# Patient Record
Sex: Female | Born: 1975
Health system: Southern US, Community
[De-identification: ages and names within clinical notes are randomized; demographics above are authoritative.]

## PROBLEM LIST (undated history)

## (undated) DIAGNOSIS — K859 Acute pancreatitis without necrosis or infection, unspecified: Secondary | ICD-10-CM

## (undated) DIAGNOSIS — G43909 Migraine, unspecified, not intractable, without status migrainosus: Secondary | ICD-10-CM

## (undated) DIAGNOSIS — D229 Melanocytic nevi, unspecified: Secondary | ICD-10-CM

## (undated) HISTORY — DX: Acute pancreatitis without necrosis or infection, unspecified: K85.90

## (undated) HISTORY — PX: WISDOM TOOTH EXTRACTION: SHX21

## (undated) HISTORY — PX: OTHER SURGICAL HISTORY: SHX169

---
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
## 2011-08-03 DIAGNOSIS — D229 Melanocytic nevi, unspecified: Secondary | ICD-10-CM

## 2011-08-03 HISTORY — DX: Melanocytic nevi, unspecified: D22.9

## 2011-11-18 ENCOUNTER — Emergency Department
Admission: EM | Admit: 2011-11-18 | Discharge: 2011-11-18 | Disposition: A | Discharge status: Home Health | Payer: 59 | Source: Home / Self Care | Attending: Family Medicine | Admitting: Family Medicine

## 2011-11-18 ENCOUNTER — Telehealth: Payer: Self-pay | Admitting: *Deleted

## 2011-11-18 DIAGNOSIS — R197 Diarrhea, unspecified: Secondary | ICD-10-CM

## 2011-11-18 DIAGNOSIS — R110 Nausea: Secondary | ICD-10-CM

## 2011-11-18 LAB — POCT CBC W AUTO DIFF (K'VILLE URGENT CARE)

## 2011-11-18 NOTE — Discharge Instructions (Signed)
Recommend clear liquids today, then advance to [employer]    If more cold symptoms develop, begin: Mucinex D (guaifenesin with decongestant) twice daily for congestion.  Increase fluid intake, rest. May use Afrin nasal spray (or generic oxymetazoline) twice daily for about 5 days.  Also recommend using saline nasal spray several times daily and saline nasal irrigation (AYR is a common brand) Stop all antihistamines for now, and other non-prescription cough/cold preparations. May take Delsym Cough Suppressant at bedtime for nighttime cough.  May take Ibuprofen 200mg , 4 tabs every 8 hours with food for chest/sternum discomfort.    Clear Liquid Diet The clear liquid dietconsists of foods that are liquid or will become liquid at room temperature.You should be able to see through the liquid and beverages. Examples of foods allowed on a clear liquid diet include fruit juice, broth or bouillon, gelatin, or frozen ice pops. The purpose of this diet is to provide necessary fluid, electrolytes such a sodium and potassium, and energy to keep the body functioning during times when you are not able to consume a regular diet.A clear liquid diet should not be continued for long periods of time as it is not nutritionally adequate.  REASONS FOR USING A CLEAR LIQUID DIET  In sudden onset (acute) conditions for a patient before or after surgery.   As the first step in oral feeding.   For fluid and electrolyte replacement in diarrheal diseases.   As a diet before certain medical tests are performed.  ADEQUACY The clear liquid diet is adequate only in ascorbic acid, according to the Recommended Dietary Allowances of the [employer]. CHOOSING FOODS Breads and Starches  Allowed:  None are allowed.   Avoid: All are avoided.  Vegetables  Allowed:  Strained tomato or vegetable juice.   Avoid: Any others.  Fruit  Allowed:  Strained fruit juices and fruit drinks. Include 1 serving of citrus  or vitamin C-enriched fruit juice daily.   Avoid: Any others.  Meat and Meat Substitutes  Allowed:  None are allowed.   Avoid: All are avoided.  Milk  Allowed:  None are allowed.   Avoid: All are avoided.  Soups and Combination Foods  Allowed:  Clear bouillon, broth, or strained broth-based soups.   Avoid: Any others.  Desserts and Sweets  Allowed:  Sugar, honey. High protein gelatin. Flavored gelatin, ices, or frozen ice pops that do not contain milk.   Avoid: Any others.  Fats and Oils  Allowed:  None are allowed.   Avoid: All are avoided.  Beverages  Allowed:  Carbonated beverages, cereal beverages, coffee (regular or decaffeinated), or tea.   Avoid: Any others.  Condiments  Allowed:  Iodized salt.   Avoid: Any others, including pepper.  Supplements  Allowed:  Liquid nutrition beverages.   Avoid: Any others that contain lactose or fiber.  SAMPLE MEAL PLAN Breakfast  4 oz strained orange juice.    to 1 cup gelatin (plain or fortified).   1 cup beverage (coffee or tea).   Sugar, if desired.  Midmorning Snack   cup gelatin (plain or fortified).  Lunch  1 cup broth or consomm.   4 oz strained grapefruit juice.    cup gelatin (plain or fortified).   1 cup beverage (coffee or tea).   Sugar, if desired.  Midafternoon Snack   cup fruit ice.    cup strained fruit juice.  Dinner  1 cup broth or consomm.    cup cranberry juice.    cup  flavored gelatin (plain or fortified).   1 cup beverage (coffee or tea).   Sugar, if desired.  Evening Snack  4 oz strained apple juice (vitamin C-fortified).    cup flavored gelatin (plain or fortified).  Document Released: 09/05/2005 Document Revised: 05/18/2011 Document Reviewed: 12/03/2010 [facility] Patient Information 2012 [place], [place].  B.R.A.T. Diet Your doctor has recommended the B.R.A.T. diet for you or your child until the condition improves. This is often used to help control  diarrhea and vomiting symptoms. If you or your child can tolerate clear liquids, you may have:  Bananas.   Rice.   Applesauce.   Toast (and other simple starches such as crackers, potatoes, noodles).  Be sure to avoid dairy products, meats, and fatty foods until symptoms are better. Fruit juices such as apple, grape, and prune juice can make diarrhea worse. Avoid these. Continue this diet for 2 days or as instructed by your caregiver. Document Released: 09/05/2005 Document Revised: 05/18/2011 Document Reviewed: 02/22/2007 [facility] Patient Information 2012 [place], [place].

## 2011-11-18 NOTE — ED Provider Notes (Signed)
History     CSN: [id number]  Arrival date & time 11/18/11  1191   First MD Initiated Contact with Patient 11/18/11 [phone]      Chief Complaint  Patient presents with  . Nasal Congestion    x 4 days      HPI Comments: Patient complains of onset of nasal congestion four days ago without other URI symptoms.  Last night she developed flu-like symptoms with myalgias, fatigue, headache, and chills/sweats.  She also had nausea without vomiting and diarrhea twice.  No abdominal pain.  Today she still has nausea, and diarrhea has resolved.  No urinary symptoms.  Her two children have had similar illness.  Denies recent foreign travel, or drinking untreated water in a wilderness environment.   The history is provided by the patient.    History reviewed. No pertinent past medical history.  History reviewed. No pertinent past surgical history.  Family History  Problem Relation Age of Onset  . Thyroid disease Father     History  Substance Use Topics  . Smoking status: Never Smoker   . Smokeless tobacco: Never Used  . Alcohol Use: No    OB History    Grav Para Term Preterm Abortions TAB SAB Ect Mult Living                  Review of Systems No sore throat No cough No pleuritic pain No wheezing + nasal congestion + post-nasal drainage ? sinus pain/pressure No itchy/red eyes No earache No hemoptysis No SOB No fever, + chills + nausea No vomiting No abdominal pain + diarrhea No urinary symptoms No skin rashes + fatigue + myalgias + headache Used OTC meds without relief  Allergies  Review of patient's allergies indicates no known allergies.  Home Medications  No current outpatient prescriptions on file.  BP 111/79  Pulse 101  Temp(Src) 98.5 F (36.9 C) (Oral)  Resp 16  Ht 5\' 2"  (1.575 m)  Wt 162 lb (73.483 kg)  BMI 29.63 kg/m2  SpO2 98%  Physical Exam Nursing notes and Vital Signs reviewed. Appearance:  Patient appears healthy, stated age, and in no acute  distress Eyes:  Pupils are equal, round, and reactive to light and accomodation.  Extraocular movement is intact.  Conjunctivae are not inflamed  Ears:  Canals normal.  Tympanic membranes normal.  Nose:  Mildly congested turbinates.  Mild maxillary sinus tenderness is present.  Pharynx:  Normal Neck:  Supple.  Slightly tender shotty posterior nodes are palpated bilaterally  Lungs:  Clear to auscultation.  Breath sounds are equal.  Chest:  Distinct tenderness to palpation over the mid-sternum.  Heart:  Regular rate and rhythm without murmurs, rubs, or gallops.  Abdomen:  Nontender without masses or hepatosplenomegaly.  Bowel sounds are present and increased.  No CVA or flank tenderness.  Extremities:  No edema.  No calf tenderness Skin:  No rash present.   ED Course  Procedures  none   Labs Reviewed  POCT CBC W AUTO DIFF ([facility]) CBC:  WBC 5.7; LY 11.7; MO 8.6; GR 79.7; Hgb 14.8; plates 956       1. Nausea   2. Diarrhea   Suspect viral syndrome.    MDM   Recommend clear liquids today, then advance to [employer]    If more cold symptoms develop, begin: Mucinex D (guaifenesin with decongestant) twice daily for congestion.  Increase fluid intake, rest. May use Afrin nasal spray (or generic oxymetazoline) twice daily for  about 5 days.  Also recommend using saline nasal spray several times daily and saline nasal irrigation (AYR is a common brand) Stop all antihistamines for now, and other non-prescription cough/cold preparations. May take Delsym Cough Suppressant at bedtime for nighttime cough.  May take Ibuprofen 200mg , 4 tabs every 8 hours with food for chest/sternum discomfort. Followup with PCP if not improving.        Donna Christen, MD 11/18/11 [phone]

## 2011-11-18 NOTE — ED Notes (Signed)
Patient complains of nasal congestion x 4 days. She also started last night with nausea, diarrhea, stomach pain, fever, chills and night sweats. Headache for 2 days. She did try Dayquil and Nyquil with little relief

## 2011-11-20 ENCOUNTER — Telehealth: Payer: Self-pay | Admitting: Family Medicine

## 2012-06-20 ENCOUNTER — Encounter: Payer: Self-pay | Admitting: *Deleted

## 2012-06-20 ENCOUNTER — Emergency Department
Admission: EM | Admit: 2012-06-20 | Discharge: 2012-06-20 | Disposition: A | Discharge status: Home / Self Care | Payer: 59 | Source: Home / Self Care | Attending: Family Medicine | Admitting: Family Medicine

## 2012-06-20 DIAGNOSIS — L739 Follicular disorder, unspecified: Secondary | ICD-10-CM

## 2012-06-20 DIAGNOSIS — L738 Other specified follicular disorders: Secondary | ICD-10-CM

## 2012-06-20 MED ORDER — DOXYCYCLINE HYCLATE 100 MG PO TABS: 100.0000 mg | ORAL_TABLET | Freq: Two times a day (BID) | ORAL | Status: DC

## 2012-06-20 NOTE — ED Notes (Signed)
Pt c/o rash under her RT arm off and on x 6 wks. She has applied hydrocortisone cream with some relief. Denies fever.

## 2012-06-20 NOTE — ED Provider Notes (Signed)
History     CSN: [id number]  Arrival date & time 06/20/12  1851   First MD Initiated Contact with Patient 06/20/12 1855      Chief Complaint  Patient presents with  . Rash      HPI Comments: Pt c/o rash under her RT arm off and on x 6 wks. She has applied hydrocortisone cream with some relief. Denies fever.  Patient is a 36 y.o. female presenting with rash. The history is provided by the patient.  Rash  This is a new problem. Episode onset: 6 weeks ago. The problem has not changed since onset.The problem is associated with a new detergent/soap (shaving). There has been no fever. Affected Location: right axilla. The patient is experiencing no pain. Associated symptoms include itching. Pertinent negatives include no blisters, no pain and no weeping. Treatments tried: 1% hydrocortisone cream. The treatment provided mild relief.    History reviewed. No pertinent past medical history.  History reviewed. No pertinent past surgical history.  Family History  Problem Relation Age of Onset  . Thyroid disease Father     History  Substance Use Topics  . Smoking status: Never Smoker   . Smokeless tobacco: Never Used  . Alcohol Use: No    OB History    Grav Para Term Preterm Abortions TAB SAB Ect Mult Living                  Review of Systems  Skin: Positive for itching and rash.  All other systems reviewed and are negative.    Allergies  Review of patient's allergies indicates no known allergies.  Home Medications   Current Outpatient Rx  Name Route Sig Dispense Refill  . UNKNOWN TO PATIENT      . DOXYCYCLINE HYCLATE 100 MG PO TABS Oral Take 1 tablet (100 mg total) by mouth 2 (two) times daily. 20 tablet 0    BP 111/72  Pulse 69  Temp 98.3 F (36.8 C) (Oral)  Resp 16  Ht 5\' 2"  (1.575 m)  Wt 162 lb (73.483 kg)  BMI 29.63 kg/m2  SpO2 100%  LMP 06/17/2012  Physical Exam  Constitutional: She is oriented to person, place, and time. She appears well-developed and  well-nourished. No distress.  HENT:  Head: Atraumatic.  Eyes: Pupils are equal, round, and reactive to light.  Neurological: She is alert and oriented to person, place, and time.  Skin: Skin is warm and dry. Rash noted. Rash is pustular. Rash is not urticarial.          Right axilla reveals numerous tiny (1mm dia) pustules located at follicles with surrounding erythema about 1cm dia.  No tenderness, swelling, or fluctuance.    ED Course  Procedures  With sterile technique, opened two small pustules in right axilla with 25ga needle to obtain culture specimen.  Applied Bacitracin ointment.   Labs Reviewed  WOUND CULTURE pending      1. Folliculitis       MDM  Culture pending from two pustules Begin doxycycline.  Advised to stop shaving and using deoderant right axilla until condition resolved. Followup with dermatologist if not improving.          Lattie Haw, MD 06/20/12 [phone]

## 2012-06-22 ENCOUNTER — Telehealth: Payer: Self-pay

## 2012-06-22 NOTE — ED Notes (Signed)
Wound culture still pending.

## 2012-06-23 ENCOUNTER — Telehealth: Payer: Self-pay | Admitting: Emergency Medicine

## 2012-06-23 LAB — WOUND CULTURE: Gram Stain: NONE SEEN

## 2012-09-13 ENCOUNTER — Encounter: Payer: Self-pay | Admitting: Emergency Medicine

## 2012-09-13 ENCOUNTER — Emergency Department
Admission: EM | Admit: 2012-09-13 | Discharge: 2012-09-13 | Disposition: A | Discharge status: Home / Self Care | Payer: 59 | Source: Home / Self Care | Attending: Family Medicine | Admitting: Family Medicine

## 2012-09-13 DIAGNOSIS — R062 Wheezing: Secondary | ICD-10-CM

## 2012-09-13 DIAGNOSIS — J069 Acute upper respiratory infection, unspecified: Secondary | ICD-10-CM

## 2012-09-13 MED ORDER — METHYLPREDNISOLONE ACETATE 80 MG/ML IJ SUSP
80.0000 mg | Freq: Once | INTRAMUSCULAR | Status: AC
  Administered 2012-09-13: 80 mg via INTRAMUSCULAR

## 2012-09-13 MED ORDER — HYDROCOD POLST-CHLORPHEN POLST 10-8 MG/5ML PO LQCR: 5.0000 mL | Freq: Two times a day (BID) | ORAL | Status: DC | PRN

## 2012-09-13 NOTE — ED Provider Notes (Signed)
History     CSN: [id number]  Arrival date & time 09/13/12  0904   First MD Initiated Contact with Patient 09/13/12 [phone]      Chief Complaint  Patient presents with  . URI   HPI  URI Symptoms Onset: 4-5 days  Description: rhinorrhea, nasal congestion, cough, mild wheezing, generalized malaise Modifying factors:  No hx/o asthma, has not had flu shot   Symptoms Nasal discharge: yes Fever: no Sore throat: mild; resolving  Cough: yes Wheezing: yes Ear pain: no GI symptoms: no Sick contacts: yes  Red Flags  Stiff neck:no Dyspnea: no Rash: no Swallowing difficulty: no  Sinusitis Risk Factors Headache/face pain: no Double sickening: no tooth pain: no  Allergy Risk Factors Sneezing: no Itchy scratchy throat: no Seasonal symptoms: no  Flu Risk Factors Headache: no muscle aches: mild severe fatigue: mild   History reviewed. No pertinent past medical history.  History reviewed. No pertinent past surgical history.  Family History  Problem Relation Age of Onset  . Thyroid disease Father     History  Substance Use Topics  . Smoking status: Never Smoker   . Smokeless tobacco: Never Used  . Alcohol Use: No    OB History    Grav Para Term Preterm Abortions TAB SAB Ect Mult Living                  Review of Systems  All other systems reviewed and are negative.    Allergies  Review of patient's allergies indicates not on file.  Home Medications   Current Outpatient Rx  Name  Route  Sig  Dispense  Refill  . HYDROCOD POLST-CPM POLST ER 10-8 MG/5ML PO LQCR   Oral   Take 5 mLs by mouth every 12 (twelve) hours as needed (cough).   60 mL   0   . DOXYCYCLINE HYCLATE 100 MG PO TABS   Oral   Take 1 tablet (100 mg total) by mouth 2 (two) times daily.   20 tablet   0   . UNKNOWN TO PATIENT                 BP 120/83  Pulse 94  Temp 98.4 F (36.9 C) (Oral)  Resp 16  Ht 5\' 2"  (1.575 m)  Wt 158 lb (71.668 kg)  BMI 28.90 kg/m2  SpO2 98%   LMP 09/06/2012  Physical Exam  Constitutional: She appears well-developed and well-nourished.  HENT:  Head: Normocephalic and atraumatic.  Right Ear: External ear normal.  Left Ear: External ear normal.       +nasal erythema, rhinorrhea bilaterally, + post oropharyngeal erythema    Eyes: Conjunctivae normal are normal. Pupils are equal, round, and reactive to light.  Neck: Normal range of motion. Neck supple.  Cardiovascular: Normal rate, regular rhythm and normal heart sounds.   Pulmonary/Chest: Effort normal.       Faint wheezes in bases    Abdominal: Soft. Bowel sounds are normal.  Musculoskeletal: Normal range of motion.  Neurological: She is alert.  Skin: Skin is warm.    ED Course  Procedures (including critical care time)  Labs Reviewed - No data to display No results found.   1. URI (upper respiratory infection)   2. Wheezing       MDM  depomedrol 80mg  IM x1 for wheezing component.  Tussionex for cough.  Discussed supportive care and infectious red flags.  No indications for abx or imaging at this time. Otherwise follow up as needed.  The patient and/or caregiver has been counseled thoroughly with regard to treatment plan and/or medications prescribed including dosage, schedule, interactions, rationale for use, and possible side effects and they verbalize understanding. Diagnoses and expected course of recovery discussed and will return if not improved as expected or if the condition worsens. Patient and/or caregiver verbalized understanding.             Doree Albee, MD 09/13/12 1004

## 2012-09-13 NOTE — ED Notes (Signed)
Headache, dry cough, clear nasal drainage, sore throat, congestion, chills x 4 days

## 2012-10-28 ENCOUNTER — Emergency Department
Admission: EM | Admit: 2012-10-28 | Discharge: 2012-10-28 | Disposition: A | Discharge status: Home / Self Care | Payer: 59 | Source: Home / Self Care | Attending: Family Medicine | Admitting: Family Medicine

## 2012-10-28 DIAGNOSIS — J329 Chronic sinusitis, unspecified: Secondary | ICD-10-CM

## 2012-10-28 MED ORDER — AMOXICILLIN-POT CLAVULANATE 875-125 MG PO TABS: 1.0000 | ORAL_TABLET | Freq: Two times a day (BID) | ORAL | Status: DC

## 2012-10-28 NOTE — ED Notes (Signed)
Sinus pressure started three weeks ago.

## 2012-10-28 NOTE — ED Provider Notes (Signed)
History     CSN: [id number]  Arrival date & time 10/28/12  1357   First MD Initiated Contact with Patient 10/28/12 1406      Chief Complaint  Patient presents with  . Facial Pain   HPI  SINUSITIS Onset:  3 weeks  Location: bilateral maxillary and frontal sinuses  Description:bilateral sinus pressure and pain  Modifying factors: Has had URI sxs for the past 3 weeks. Developed sinus sxs over last 4-5 days. Worsening sxs.   Symptoms Cough:  no Discharge:  yes Fever: no Sinus Pressure:  yes Ears Blocked:  no Teeth Ache:  yes Frontal Headache:  yes Second Sickening:  yes  Red Flags Change in mental state: no Change in vision: no    History reviewed. No pertinent past medical history.  History reviewed. No pertinent past surgical history.  Family History  Problem Relation Age of Onset  . Thyroid disease Father     History  Substance Use Topics  . Smoking status: Never Smoker   . Smokeless tobacco: Never Used  . Alcohol Use: No    OB History   Grav Para Term Preterm Abortions TAB SAB Ect Mult Living                  Review of Systems  All other systems reviewed and are negative.    Allergies  Review of patient's allergies indicates no known allergies.  Home Medications   Current Outpatient Rx  Name  Route  Sig  Dispense  Refill  . amoxicillin-clavulanate (AUGMENTIN) 875-125 MG per tablet   Oral   Take 1 tablet by mouth 2 (two) times daily.   20 tablet   0   . chlorpheniramine-HYDROcodone (TUSSIONEX PENNKINETIC ER) 10-8 MG/5ML LQCR   Oral   Take 5 mLs by mouth every 12 (twelve) hours as needed (cough).   60 mL   0   . doxycycline (VIBRA-TABS) 100 MG tablet   Oral   Take 1 tablet (100 mg total) by mouth 2 (two) times daily.   20 tablet   0   . UNKNOWN TO PATIENT                 BP 97/65  Pulse 92  Temp(Src) 98.2 F (36.8 C) (Oral)  Ht 5\' 2"  (1.575 m)  Wt 158 lb (71.668 kg)  BMI 28.89 kg/m2  SpO2 100%  LMP  10/07/2012  Physical Exam  Constitutional: She appears well-developed and well-nourished.  HENT:  Head: Normocephalic and atraumatic.  Right Ear: External ear normal.  Left Ear: External ear normal.  +nasal erythema, rhinorrhea bilaterally, + post oropharyngeal erythema  + maxillary sinus TTP bilaterally    Eyes: Conjunctivae are normal. Pupils are equal, round, and reactive to light.  Neck: Normal range of motion. Neck supple.  Cardiovascular: Normal rate, regular rhythm and normal heart sounds.   Pulmonary/Chest: Effort normal and breath sounds normal.  Abdominal: Soft.  Musculoskeletal: Normal range of motion.  Lymphadenopathy:    She has no cervical adenopathy.  Neurological: She is alert.  Skin: Skin is warm.    ED Course  Procedures (including critical care time)  Labs Reviewed - No data to display No results found.   1. Sinusitis       MDM  Will treat with augmentin.  Discussed infectious and ENT/resp red flags. Follow up as needed.     The patient and/or caregiver has been counseled thoroughly with regard to treatment plan and/or medications prescribed including dosage, schedule,  interactions, rationale for use, and possible side effects and they verbalize understanding. Diagnoses and expected course of recovery discussed and will return if not improved as expected or if the condition worsens. Patient and/or caregiver verbalized understanding.             Doree Albee, MD 10/28/12 [phone]

## 2013-08-06 ENCOUNTER — Encounter: Payer: Self-pay | Admitting: Emergency Medicine

## 2013-08-06 ENCOUNTER — Emergency Department
Admission: EM | Admit: 2013-08-06 | Discharge: 2013-08-06 | Disposition: A | Discharge status: Home / Self Care | Payer: 59 | Source: Home / Self Care | Attending: Emergency Medicine | Admitting: Emergency Medicine

## 2013-08-06 DIAGNOSIS — J029 Acute pharyngitis, unspecified: Secondary | ICD-10-CM

## 2013-08-06 MED ORDER — AMOXICILLIN 875 MG PO TABS: 875.0000 mg | ORAL_TABLET | Freq: Two times a day (BID) | ORAL | Status: DC

## 2013-08-06 NOTE — ED Provider Notes (Signed)
CSN: [id number]     Arrival date & time 08/06/13  1841 History   First MD Initiated Contact with Patient 08/06/13 1844     No chief complaint on file.  (Consider location/radiation/quality/duration/timing/severity/associated sxs/prior Treatment) HPI Miranda Pena is a 37 y.o. female who complains of onset of cold symptoms for 1 day.  The symptoms are constant and mild in severity.  Her child was diagnosed with strep throat today (positive swab) and they shared a glass 2 days ago. + sore throat (mild) No cough No pleuritic pain No wheezing No nasal congestion No post-nasal drainage No sinus pain/pressure No chest congestion No itchy/red eyes No earache No hemoptysis No SOB + chills No fever No nausea No vomiting No abdominal pain No diarrhea No skin rashes + fatigue No myalgias No headache     No past medical history on file. No past surgical history on file. Family History  Problem Relation Age of Onset  . Thyroid disease Father    History  Substance Use Topics  . Smoking status: Never Smoker   . Smokeless tobacco: Never Used  . Alcohol Use: No   OB History   Grav Para Term Preterm Abortions TAB SAB Ect Mult Living                 Review of Systems  All other systems reviewed and are negative.    Allergies  Review of patient's allergies indicates no known allergies.  Home Medications   Current Outpatient Rx  Name  Route  Sig  Dispense  Refill  . amoxicillin-clavulanate (AUGMENTIN) 875-125 MG per tablet   Oral   Take 1 tablet by mouth 2 (two) times daily.   20 tablet   0   . chlorpheniramine-HYDROcodone (TUSSIONEX PENNKINETIC ER) 10-8 MG/5ML LQCR   Oral   Take 5 mLs by mouth every 12 (twelve) hours as needed (cough).   60 mL   0   . doxycycline (VIBRA-TABS) 100 MG tablet   Oral   Take 1 tablet (100 mg total) by mouth 2 (two) times daily.   20 tablet   0   . UNKNOWN TO PATIENT                There were no vitals taken for this  visit. Physical Exam  Nursing note and vitals reviewed. Constitutional: She is oriented to person, place, and time. She appears well-developed and well-nourished.  HENT:  Head: Normocephalic and atraumatic.  Right Ear: Tympanic membrane, external ear and ear canal normal.  Left Ear: Tympanic membrane, external ear and ear canal normal.  Nose: Nose normal.  Mouth/Throat: No oropharyngeal exudate, posterior oropharyngeal edema or posterior oropharyngeal erythema.  Eyes: No scleral icterus.  Neck: Neck supple.  Cardiovascular: Regular rhythm and normal heart sounds.   Pulmonary/Chest: Effort normal and breath sounds normal. No respiratory distress.  Lymphadenopathy:    She has no cervical adenopathy.  Neurological: She is alert and oriented to person, place, and time.  Skin: Skin is warm and dry.  Psychiatric: She has a normal mood and affect. Her speech is normal.    ED Course  Procedures (including critical care time) Labs Review Labs Reviewed - No data to display Imaging Review No results found.  EKG Interpretation    Date/Time:    Ventricular Rate:    PR Interval:    QRS Duration:   QT Interval:    QTC Calculation:   R Axis:     Text Interpretation:  MDM   1. Acute pharyngitis    Take the prescribed antibiotic as instructed.  History of sharing in class as well as a positive swab today for strep, and her new onset sore throat and chills and fatigue, I think is reasonable to treat.  I called her in amoxicillin to her pharmacy and she will wake up tomorrow morning and see how she feels.  If still with a sore throat or getting worse she will start the antibiotics.  No swab culture was done today. Can take tylenol every 6 hours or motrin every 8 hours for pain or fever. Follow up with your primary doctor if no improvement in 5-7 days, sooner if increasing pain, fever, or new symptoms.     Marlaine Hind, MD 08/06/13 [phone]

## 2013-08-06 NOTE — ED Notes (Signed)
Sore throat, fatigue, headache today. 37 yr-old son dx w/strep today

## 2013-09-10 ENCOUNTER — Encounter: Payer: Self-pay | Admitting: Emergency Medicine

## 2013-09-10 ENCOUNTER — Emergency Department (INDEPENDENT_AMBULATORY_CARE_PROVIDER_SITE_OTHER)
Admission: EM | Admit: 2013-09-10 | Discharge: 2013-09-10 | Disposition: A | Discharge status: Home / Self Care | Payer: 59 | Source: Home / Self Care | Attending: Family Medicine | Admitting: Family Medicine

## 2013-09-10 DIAGNOSIS — J0100 Acute maxillary sinusitis, unspecified: Secondary | ICD-10-CM

## 2013-09-10 MED ORDER — AMOXICILLIN 875 MG PO TABS: 875.0000 mg | ORAL_TABLET | Freq: Two times a day (BID) | ORAL | Status: DC

## 2013-09-10 NOTE — ED Notes (Signed)
Pt c/o URI x 2 wks, with 4-5 days of nasal congestion, thick green nasal d/c, and sinus pressure. Denies fever.

## 2013-09-10 NOTE — ED Provider Notes (Signed)
CSN: [id number]     Arrival date & time 09/10/13  1191 History   First MD Initiated Contact with Patient 09/10/13 [phone]     Chief Complaint  Patient presents with  . Nasal Congestion  . Facial Pain      HPI Comments: Patient complains of onset of a mild cold about two weeks ago with runny nose and sore throat.  She has had minimal cough.  She has now developed increasing facial pressure and post nasal drainage.  No fevers, chills, and sweats   The history is provided by the patient.    History reviewed. No pertinent past medical history. History reviewed. No pertinent past surgical history. Family History  Problem Relation Age of Onset  . Thyroid disease Father    History  Substance Use Topics  . Smoking status: Never Smoker   . Smokeless tobacco: Never Used  . Alcohol Use: No   OB History   Grav Para Term Preterm Abortions TAB SAB Ect Mult Living                 Review of Systems No sore throat + minimal cough No pleuritic pain No wheezing + nasal congestion + post-nasal drainage + sinus pain/pressure No itchy/red eyes No earache No hemoptysis No SOB No fever/chills No nausea No vomiting No abdominal pain No diarrhea No urinary symptoms No skin rash No fatigue No myalgias + headache Used OTC meds without relief  Allergies  Review of patient's allergies indicates no known allergies.  Home Medications   Current Outpatient Rx  Name  Route  Sig  Dispense  Refill  . amoxicillin (AMOXIL) 875 MG tablet   Oral   Take 1 tablet (875 mg total) by mouth 2 (two) times daily.   20 tablet   0   . UNKNOWN TO PATIENT                BP 115/83  Pulse 83  Temp(Src) 98.9 F (37.2 C) (Oral)  Resp 18  Ht 5\' 2"  (1.575 m)  Wt 150 lb (68.04 kg)  BMI 27.43 kg/m2  SpO2 98%  LMP 09/09/2013 Physical Exam Nursing notes and Vital Signs reviewed. Appearance:  Patient appears healthy, stated age, and in no acute distress Eyes:  Pupils are equal, round, and  reactive to light and accomodation.  Extraocular movement is intact.  Conjunctivae are not inflamed  Ears:  Canals normal.  Tympanic membranes normal.  Nose:  Mildly congested turbinates.   Maxillary sinus tenderness is present.  Pharynx:  Normal Neck:  Supple.  Slightly tender shotty posterior nodes are palpated bilaterally  Lungs:  Clear to auscultation.  Breath sounds are equal.  Heart:  Regular rate and rhythm without murmurs, rubs, or gallops.  Abdomen:  Nontender without masses or hepatosplenomegaly.  Bowel sounds are present.  No CVA or flank tenderness.  Extremities:  No edema.   Skin:  No rash present.   Procedures  none       MDM   1. Acute maxillary sinusitis    Begin amoxicillin for 10 days Continue plain Mucinex (1200 mg guaifenesin) twice daily for cough and congestion.  Continue Sudafed.  Increase fluid intake, rest. May use Afrin nasal spray (or generic oxymetazoline) twice daily for about 5 days.  Also recommend using saline nasal spray several times daily and saline nasal irrigation (AYR is a common brand) Stop all antihistamines for now, and other non-prescription cough/cold preparations. Followup with Family Doctor if not improved in one week.  Lattie Haw, MD 09/10/13 [phone]

## 2014-05-02 ENCOUNTER — Emergency Department
Admission: EM | Admit: 2014-05-02 | Discharge: 2014-05-02 | Disposition: A | Discharge status: Home / Self Care | Payer: 59 | Source: Home / Self Care | Attending: Family Medicine | Admitting: Family Medicine

## 2014-05-02 ENCOUNTER — Emergency Department (HOSPITAL_COMMUNITY): Payer: 59

## 2014-05-02 ENCOUNTER — Encounter (HOSPITAL_COMMUNITY): Payer: Self-pay | Admitting: Emergency Medicine

## 2014-05-02 ENCOUNTER — Encounter: Payer: Self-pay | Admitting: Emergency Medicine

## 2014-05-02 ENCOUNTER — Inpatient Hospital Stay (HOSPITAL_COMMUNITY)
Admission: EM | Admit: 2014-05-02 | Discharge: 2014-05-05 | DRG: 440 | Disposition: A | Discharge status: Home / Self Care | Payer: 59 | Attending: Internal Medicine | Admitting: Internal Medicine

## 2014-05-02 DIAGNOSIS — K858 Other acute pancreatitis without necrosis or infection: Secondary | ICD-10-CM

## 2014-05-02 DIAGNOSIS — R1013 Epigastric pain: Secondary | ICD-10-CM | POA: Diagnosis not present

## 2014-05-02 DIAGNOSIS — R112 Nausea with vomiting, unspecified: Secondary | ICD-10-CM

## 2014-05-02 DIAGNOSIS — K219 Gastro-esophageal reflux disease without esophagitis: Secondary | ICD-10-CM

## 2014-05-02 DIAGNOSIS — G43909 Migraine, unspecified, not intractable, without status migrainosus: Secondary | ICD-10-CM | POA: Diagnosis present

## 2014-05-02 DIAGNOSIS — K859 Acute pancreatitis without necrosis or infection, unspecified: Principal | ICD-10-CM | POA: Diagnosis present

## 2014-05-02 DIAGNOSIS — D72829 Elevated white blood cell count, unspecified: Secondary | ICD-10-CM

## 2014-05-02 DIAGNOSIS — Z79899 Other long term (current) drug therapy: Secondary | ICD-10-CM

## 2014-05-02 HISTORY — DX: Migraine, unspecified, not intractable, without status migrainosus: G43.909

## 2014-05-02 LAB — URINALYSIS, ROUTINE W REFLEX MICROSCOPIC
BILIRUBIN URINE: NEGATIVE
Glucose, UA: NEGATIVE mg/dL
NITRITE: NEGATIVE
PROTEIN: 30 mg/dL — AB
Specific Gravity, Urine: 1.023 (ref 1.005–1.030)
UROBILINOGEN UA: 1 mg/dL (ref 0.0–1.0)
pH: 8 (ref 5.0–8.0)

## 2014-05-02 LAB — CBC WITH DIFFERENTIAL/PLATELET
BASOS ABS: 0 10*3/uL (ref 0.0–0.1)
BASOS PCT: 0 % (ref 0–1)
EOS ABS: 0.3 10*3/uL (ref 0.0–0.7)
EOS PCT: 2 % (ref 0–5)
HEMATOCRIT: 43.3 % (ref 36.0–46.0)
Hemoglobin: 15.2 g/dL — ABNORMAL HIGH (ref 12.0–15.0)
LYMPHS PCT: 11 % — AB (ref 12–46)
Lymphs Abs: 1.6 10*3/uL (ref 0.7–4.0)
MCH: 32.1 pg (ref 26.0–34.0)
MCHC: 35.1 g/dL (ref 30.0–36.0)
MCV: 91.4 fL (ref 78.0–100.0)
MONO ABS: 0.8 10*3/uL (ref 0.1–1.0)
Monocytes Relative: 5 % (ref 3–12)
Neutro Abs: 12.3 10*3/uL — ABNORMAL HIGH (ref 1.7–7.7)
Neutrophils Relative %: 82 % — ABNORMAL HIGH (ref 43–77)
Platelets: 363 10*3/uL (ref 150–400)
RBC: 4.74 MIL/uL (ref 3.87–5.11)
RDW: 12 % (ref 11.5–15.5)
WBC: 15 10*3/uL — ABNORMAL HIGH (ref 4.0–10.5)

## 2014-05-02 LAB — COMPREHENSIVE METABOLIC PANEL
ALT: 17 U/L (ref 0–35)
AST: 20 U/L (ref 0–37)
Albumin: 4.1 g/dL (ref 3.5–5.2)
Alkaline Phosphatase: 47 U/L (ref 39–117)
Anion gap: 15 (ref 5–15)
BUN: 17 mg/dL (ref 6–23)
CALCIUM: 8.9 mg/dL (ref 8.4–10.5)
CO2: 26 meq/L (ref 19–32)
CREATININE: 0.56 mg/dL (ref 0.50–1.10)
Chloride: 98 mEq/L (ref 96–112)
GFR calc Af Amer: >90 mL/min (ref >90)
Glucose, Bld: 98 mg/dL (ref 70–99)
Potassium: 3.8 mEq/L (ref 3.7–5.3)
Sodium: 139 mEq/L (ref 137–147)
TOTAL PROTEIN: 7.7 g/dL (ref 6.0–8.3)
Total Bilirubin: 0.4 mg/dL (ref 0.3–1.2)

## 2014-05-02 LAB — URINE MICROSCOPIC-ADD ON

## 2014-05-02 LAB — LIPASE, BLOOD: LIPASE: 2876 U/L — AB (ref 11–59)

## 2014-05-02 LAB — PREGNANCY, URINE: Preg Test, Ur: NEGATIVE

## 2014-05-02 LAB — POC OCCULT BLOOD, ED: Fecal Occult Bld: NEGATIVE

## 2014-05-02 MED ORDER — MORPHINE SULFATE 4 MG/ML IJ SOLN
4.0000 mg | Freq: Once | INTRAMUSCULAR | Status: AC
  Administered 2014-05-02: 4 mg via INTRAVENOUS
  Filled 2014-05-02: qty 1

## 2014-05-02 MED ORDER — GI COCKTAIL ~~LOC~~
30.0000 mL | Freq: Once | ORAL | Status: AC
  Administered 2014-05-02: 30 mL via ORAL
  Filled 2014-05-02: qty 30

## 2014-05-02 MED ORDER — SODIUM CHLORIDE 0.9 % IV BOLUS (SEPSIS)
500.0000 mL | Freq: Once | INTRAVENOUS | Status: AC
  Administered 2014-05-02: 500 mL via INTRAVENOUS

## 2014-05-02 MED ORDER — IOHEXOL 300 MG/ML  SOLN
80.0000 mL | Freq: Once | INTRAMUSCULAR | Status: AC | PRN
  Administered 2014-05-02: 80 mL via INTRAVENOUS

## 2014-05-02 MED ORDER — PANTOPRAZOLE SODIUM 40 MG IV SOLR
40.0000 mg | Freq: Once | INTRAVENOUS | Status: AC
  Administered 2014-05-02: 40 mg via INTRAVENOUS
  Filled 2014-05-02: qty 40

## 2014-05-02 MED ORDER — METOCLOPRAMIDE HCL 5 MG/ML IJ SOLN
10.0000 mg | Freq: Once | INTRAMUSCULAR | Status: AC
  Administered 2014-05-02: 10 mg via INTRAVENOUS
  Filled 2014-05-02: qty 2

## 2014-05-02 MED ORDER — SODIUM CHLORIDE 0.9 % IV BOLUS (SEPSIS)
1000.0000 mL | Freq: Once | INTRAVENOUS | Status: AC
  Administered 2014-05-02: 1000 mL via INTRAVENOUS

## 2014-05-02 MED ORDER — ONDANSETRON HCL 4 MG/2ML IJ SOLN
4.0000 mg | Freq: Once | INTRAMUSCULAR | Status: AC
  Administered 2014-05-02: 4 mg via INTRAVENOUS
  Filled 2014-05-02: qty 2

## 2014-05-02 MED ORDER — ONDANSETRON HCL 8 MG PO TABS: 8.0000 mg | ORAL_TABLET | Freq: Three times a day (TID) | ORAL | Status: DC | PRN

## 2014-05-02 MED ORDER — PANTOPRAZOLE SODIUM 40 MG PO TBEC: 40.0000 mg | DELAYED_RELEASE_TABLET | Freq: Every day | ORAL | Status: DC

## 2014-05-02 MED ORDER — GI COCKTAIL ~~LOC~~
30.0000 mL | Freq: Once | ORAL | Status: AC
  Administered 2014-05-02: 30 mL via ORAL

## 2014-05-02 MED ORDER — IOHEXOL 300 MG/ML  SOLN
25.0000 mL | Freq: Once | INTRAMUSCULAR | Status: AC | PRN
  Administered 2014-05-02: 25 mL via ORAL

## 2014-05-02 NOTE — ED Notes (Signed)
Pt c/o heartburn with nausea x last night. She took pepcid and prevacid with no relief.

## 2014-05-02 NOTE — ED Notes (Signed)
Patient transported to CT 

## 2014-05-02 NOTE — ED Notes (Signed)
Called CT to make aware pt had finished contrast.

## 2014-05-02 NOTE — ED Notes (Signed)
Pt continues to be monitored by blood pressure, pulse ox, and 5 lead. Pts family remains at bedside.

## 2014-05-02 NOTE — ED Provider Notes (Signed)
Complains of epigastric pain onset approximately 1 week ago patient reports she's vomited approximately 12 times today. She feels much improved since treatment in the emergency department.  Orlie Dakin, MD 05/02/14 [phone]

## 2014-05-02 NOTE — ED Notes (Signed)
Pt continues to be monitored by blood pressure, pulse ox, and 5 lead.

## 2014-05-02 NOTE — ED Provider Notes (Signed)
Miranda Pena is a 38 y.o. female who presents to Urgent Care today for reflux. Patient developed heartburn sensation last week. This resolved on its own and returned yesterday evening. The symptoms started after she ate spaghetti and [place] food. She notes nausea but has not really had much vomiting. She's tried Pepcid and Prevacid which did not help. She denies any fevers or chills vomiting or diarrhea. She feels well otherwise.   Past Medical History  Diagnosis Date  . Migraine    History  Substance Use Topics  . Smoking status: Never Smoker   . Smokeless tobacco: Never Used  . Alcohol Use: No   ROS as above Medications: No current facility-administered medications for this encounter.   Current Outpatient Prescriptions  Medication Sig Dispense Refill  . norethindrone (MICRONOR,CAMILA,ERRIN) 0.35 MG tablet Take 1 tablet by mouth daily.      . ondansetron (ZOFRAN) 8 MG tablet Take 1 tablet (8 mg total) by mouth every 8 (eight) hours as needed for nausea or vomiting.  12 tablet  0  . pantoprazole (PROTONIX) 40 MG tablet Take 1 tablet (40 mg total) by mouth daily.  30 tablet  1    Exam:  BP 113/80  Pulse 87  Temp(Src) 98.5 F (36.9 C) (Oral)  Resp 16  Ht 5\' 2"  (1.575 m)  Wt 161 lb (73.029 kg)  BMI 29.44 kg/m2  SpO2 100% Gen: Well NAD HEENT: EOMI,  MMM Lungs: Normal work of breathing. CTABL Heart: RRR no MRG Abd: NABS, Soft. Nondistended, Nontender no rebound or guarding Exts: Brisk capillary refill, warm and well perfused.   Patient was given a GI cocktail which helped  No results found for this or any previous visit (from the past 24 hour(s)). No results found.  Assessment and Plan: 38 y.o. female with reflux versus possible esophagitis or gastritis. Plan to treat with Zofran and Protonix. Followup with primary care provider.  Discussed warning signs or symptoms. Please see discharge instructions. Patient expresses understanding.   This note was created using  Systems analyst. Any transcription errors are unintended.    Gregor Hams, MD 05/02/14 [phone]

## 2014-05-02 NOTE — Discharge Instructions (Signed)
Thank you for coming in today. Take protonix daily.  You can use them twice daily for a few days.  Use zofran as needed.  If worse go to the ER.  If not better follow up with your doctor or back here.  If your belly pain worsens, or you have high fever, bad vomiting, blood in your stool or black tarry stool go to the Emergency Room.   Gastroesophageal Reflux Disease, Adult Gastroesophageal reflux disease (GERD) happens when acid from your stomach flows up into the esophagus. When acid comes in contact with the esophagus, the acid causes soreness (inflammation) in the esophagus. Over time, GERD may create small holes (ulcers) in the lining of the esophagus. CAUSES   Increased body weight. This puts pressure on the stomach, making acid rise from the stomach into the esophagus.  Smoking. This increases acid production in the stomach.  Drinking alcohol. This causes decreased pressure in the lower esophageal sphincter (valve or ring of muscle between the esophagus and stomach), allowing acid from the stomach into the esophagus.  Late evening meals and a full stomach. This increases pressure and acid production in the stomach.  A malformed lower esophageal sphincter. Sometimes, no cause is found. SYMPTOMS   Burning pain in the lower part of the mid-chest behind the breastbone and in the mid-stomach area. This may occur twice a week or more often.  Trouble swallowing.  Sore throat.  Dry cough.  Asthma-like symptoms including chest tightness, shortness of breath, or wheezing. DIAGNOSIS  Your caregiver may be able to diagnose GERD based on your symptoms. In some cases, X-rays and other tests may be done to check for complications or to check the condition of your stomach and esophagus. TREATMENT  Your caregiver may recommend over-the-counter or prescription medicines to help decrease acid production. Ask your caregiver before starting or adding any new medicines.  HOME CARE INSTRUCTIONS     Change the factors that you can control. Ask your caregiver for guidance concerning weight loss, quitting smoking, and alcohol consumption.  Avoid foods and drinks that make your symptoms worse, such as:  Caffeine or alcoholic drinks.  Chocolate.  Peppermint or mint flavorings.  Garlic and onions.  Spicy foods.  Citrus fruits, such as oranges, lemons, or limes.  Tomato-based foods such as sauce, chili, salsa, and pizza.  Fried and fatty foods.  Avoid lying down for the 3 hours prior to your bedtime or prior to taking a nap.  Eat small, frequent meals instead of large meals.  Wear loose-fitting clothing. Do not wear anything tight around your waist that causes pressure on your stomach.  Raise the head of your bed 6 to 8 inches with wood blocks to help you sleep. Extra pillows will not help.  Only take over-the-counter or prescription medicines for pain, discomfort, or fever as directed by your caregiver.  Do not take aspirin, ibuprofen, or other nonsteroidal anti-inflammatory drugs (NSAIDs). SEEK IMMEDIATE MEDICAL CARE IF:   You have pain in your arms, neck, jaw, teeth, or back.  Your pain increases or changes in intensity or duration.  You develop nausea, vomiting, or sweating (diaphoresis).  You develop shortness of breath, or you faint.  Your vomit is green, yellow, black, or looks like coffee grounds or blood.  Your stool is red, bloody, or black. These symptoms could be signs of other problems, such as heart disease, gastric bleeding, or esophageal bleeding. MAKE SURE YOU:   Understand these instructions.  Will watch your condition.  Will  get help right away if you are not doing well or get worse. Document Released: 06/15/2005 Document Revised: 11/28/2011 Document Reviewed: 03/25/2011 [facility] Patient Information 2015 [place], [place]. This information is not intended to replace advice given to you by your health care provider. Make sure you discuss any  questions you have with your health care provider.

## 2014-05-02 NOTE — ED Provider Notes (Signed)
CSN: [id number]     Arrival date & time 05/02/14  1943 History   First MD Initiated Contact with Patient 05/02/14 2013     Chief Complaint  Patient presents with  . Emesis     (Consider location/radiation/quality/duration/timing/severity/associated sxs/prior Treatment) HPI  Past Medical History  Diagnosis Date  . Migraine    History reviewed. No pertinent past surgical history. Family History  Problem Relation Age of Onset  . Thyroid disease Father   . Thyroid disease Mother   . Migraines Sister   . Diverticulitis Sister    History  Substance Use Topics  . Smoking status: Never Smoker   . Smokeless tobacco: Never Used  . Alcohol Use: No   OB History   Grav Para Term Preterm Abortions TAB SAB Ect Mult Living                 Review of Systems    Allergies  Review of patient's allergies indicates no known allergies.  Home Medications   Prior to Admission medications   Medication Sig Start Date End Date Taking? Authorizing Provider  BIOTIN PO Take 1 tablet by mouth daily.   Yes Historical Provider, MD  Calcitriol (VECTICAL EX) Apply 1 application topically daily as needed (psorasis ./ itching).   Yes Historical Provider, MD  cholecalciferol (VITAMIN D) 400 UNITS TABS tablet Take 400 Units by mouth daily.   Yes Historical Provider, MD  MAGNESIUM PO Take 1 tablet by mouth daily.   Yes Historical Provider, MD  Multiple Vitamin (MULTI VITAMIN DAILY PO) Take 1 tablet by mouth daily.   Yes Historical Provider, MD  norethindrone (MICRONOR,CAMILA,ERRIN) 0.35 MG tablet Take 1 tablet by mouth daily.   Yes Historical Provider, MD  Omega-3 Fatty Acids (FISH OIL PO) Take 1 capsule by mouth daily.   Yes Historical Provider, MD  ondansetron (ZOFRAN) 8 MG tablet Take 8 mg by mouth every 8 (eight) hours as needed for nausea or vomiting.   Yes Historical Provider, MD  pantoprazole (PROTONIX) 40 MG tablet Take 40 mg by mouth daily.   Yes Historical Provider, MD   BP 126/76  Pulse 91   Temp(Src) 98.6 F (37 C) (Oral)  Resp 20  Ht 5\' 2"  (1.575 m)  Wt 158 lb 6.4 oz (71.85 kg)  BMI 28.96 kg/m2  SpO2 100% Physical Exam  ED Course  Procedures (including critical care time) Labs Review Labs Reviewed  URINALYSIS, ROUTINE W REFLEX MICROSCOPIC - Abnormal; Notable for the following:    APPearance CLOUDY (*)    Hgb urine dipstick SMALL (*)    Ketones, ur >80 (*)    Protein, ur 30 (*)    Leukocytes, UA SMALL (*)    All other components within normal limits  CBC WITH DIFFERENTIAL - Abnormal; Notable for the following:    WBC 15.0 (*)    Hemoglobin 15.2 (*)    Neutrophils Relative % 82 (*)    Neutro Abs 12.3 (*)    Lymphocytes Relative 11 (*)    All other components within normal limits  LIPASE, BLOOD - Abnormal; Notable for the following:    Lipase 2876 (*)    All other components within normal limits  PREGNANCY, URINE  COMPREHENSIVE METABOLIC PANEL  URINE MICROSCOPIC-ADD ON  POC OCCULT BLOOD, ED    Imaging Review Ct Abdomen Pelvis W Contrast  05/02/2014   CLINICAL DATA:  Mid to upper abdominal pain, nausea and vomiting. Leukocytosis. Constipation.  EXAM: CT ABDOMEN AND PELVIS WITH CONTRAST  TECHNIQUE:  Multidetector CT imaging of the abdomen and pelvis was performed using the standard protocol following bolus administration of intravenous contrast.  CONTRAST:  76mL OMNIPAQUE IOHEXOL 300 MG/ML  SOLN  COMPARISON:  None.  FINDINGS: The visualized lung bases are clear.  A 2.3 cm cyst is noted at the medial right hepatic lobe. The liver and spleen are otherwise unremarkable in appearance. The gallbladder is within normal limits. The pancreas and adrenal glands are unremarkable.  The patient has a horseshoe kidney. There is no evidence of hydronephrosis. No renal or ureteral stones are identified. No significant perinephric stranding is seen.  No free fluid is identified. The small bowel is unremarkable in appearance. The stomach is within normal limits. No acute vascular  abnormalities are seen.  The appendix is diminutive and grossly unremarkable in appearance, best seen on coronal images. The colon is partially filled with fluid and stool, and is unremarkable in appearance.  The bladder is mildly distended and grossly unremarkable. The uterus is within normal limits. The ovaries are relatively symmetric. No suspicious adnexal masses are seen. No inguinal lymphadenopathy is seen.  No acute osseous abnormalities are identified.  IMPRESSION: 1. No acute abnormality seen within the abdomen or pelvis. 2. Hepatic cyst noted. 3. Horseshoe kidney is grossly unremarkable in appearance.   Electronically Signed   By: Garald Balding M.D.   On: 05/02/2014 23:19     EKG Interpretation None      MDM   Final diagnoses:  None    Please delete. Duplicate note    Orlie Dakin, MD 05/03/14 [phone]

## 2014-05-02 NOTE — ED Provider Notes (Signed)
CSN: [id number]     Arrival date & time 05/02/14  1943 History   First MD Initiated Contact with Patient 05/02/14 2013     Chief Complaint  Patient presents with  . Emesis     (Consider location/radiation/quality/duration/timing/severity/associated sxs/prior Treatment) HPI  38 year old female with history of migraine presents for evaluation of nausea vomiting. Patient has a history of migraine. For the past week her migraine has been persistent. She has been taking a moderate amount of Advil to relieve her headache. She subsequently was seen by her OB/GYN and had her blood control pill changed to a non-estrogenic formula. Since the change in the medication her headache has resolved. However he has been experiencing heartburn sensation since. Yesterday  she was unable to sleep due to burning sensation to epigastric, nonradiating, worsening with eating spaghetti and [place] food. She also noticed black tarry stool several times yesterday, but none today.  Today she cannot keep anything down and has vomited once every hour. Vomitus is nonbloody nonbilious. She is able to pass flatus and have bowel movements but feels constipated. No fever, chills, chest pain, shortness of breath, productive cough, back pain, dysuria, hematuria, vaginal bleeding or vaginal discharge. Patient is a nonsmoker, no history of alcohol abuse, no history of diabetes, no recent travel.  Past Medical History  Diagnosis Date  . Migraine    History reviewed. No pertinent past surgical history. Family History  Problem Relation Age of Onset  . Thyroid disease Father   . Thyroid disease Mother   . Migraines Sister   . Diverticulitis Sister    History  Substance Use Topics  . Smoking status: Never Smoker   . Smokeless tobacco: Never Used  . Alcohol Use: No   OB History   Grav Para Term Preterm Abortions TAB SAB Ect Mult Living                 Review of Systems  All other systems reviewed and are  negative.     Allergies  Review of patient's allergies indicates no known allergies.  Home Medications   Prior to Admission medications   Medication Sig Start Date End Date Taking? Authorizing Provider  norethindrone (MICRONOR,CAMILA,ERRIN) 0.35 MG tablet Take 1 tablet by mouth daily.    Historical Provider, MD  ondansetron (ZOFRAN) 8 MG tablet Take 1 tablet (8 mg total) by mouth every 8 (eight) hours as needed for nausea or vomiting. 05/02/14   Gregor Hams, MD  pantoprazole (PROTONIX) 40 MG tablet Take 1 tablet (40 mg total) by mouth daily. 05/02/14   Gregor Hams, MD   BP 112/75  Pulse 95  Temp(Src) 98.6 F (37 C) (Oral)  Resp 18  Ht 5\' 2"  (1.575 m)  Wt 158 lb 6.4 oz (71.85 kg)  BMI 28.96 kg/m2  SpO2 100% Physical Exam  Nursing note and vitals reviewed. Constitutional: She appears well-developed and well-nourished. No distress.  HENT:  Head: Atraumatic.  Oral mucosa dry  Eyes: Conjunctivae are normal.  Neck: Neck supple.  Cardiovascular: Normal rate and regular rhythm.   Pulmonary/Chest: Effort normal and breath sounds normal. No respiratory distress. She exhibits no tenderness.  Abdominal: Soft. Bowel sounds are normal. She exhibits no distension. There is tenderness (Epigastric tenderness on palpation without guarding or rebound tenderness. Mild discomfort at right upper quadrant. No pain at McBurney's point, no signs of hernia.).  Genitourinary:  Chaperone present: On digital rectal exam, patient has normal rectal tone, no mass, no external hemorrhoid, normal color stool,  Hemoccult negative. No evidence of fecal impaction   Neurological: She is alert.  Skin: No rash noted.  Psychiatric: She has a normal mood and affect.    ED Course  Procedures (including critical care time)  9:18 PM Patient presents with symptoms suggestive of gastritis, likely peptic disease secondary to NSAID use. However her labs are remarkable for lipase of 2876, and WBC of 15 with left shift.  Urine shows greater than 80 ketones but no signs of urinary tract infection. Hemoccult is negative. Plan to treat symptoms, will obtain abdominal and pelvis CT scan for further evaluation  12:28 AM CT scan unremarkable.  Plan to have pt admitted for acute pancreatitis.  She may need further work up including MRCP if necessary.  Pt agrees with plan.  Care discussed with Dr. Winfred Leeds  12:29 AM i have consulted with [facility] Dr. Arnoldo Morale who agrees to admit pt to med surg bed, team 10, under her care.    Labs Review Labs Reviewed  URINALYSIS, ROUTINE W REFLEX MICROSCOPIC - Abnormal; Notable for the following:    APPearance CLOUDY (*)    Hgb urine dipstick SMALL (*)    Ketones, ur >80 (*)    Protein, ur 30 (*)    Leukocytes, UA SMALL (*)    All other components within normal limits  CBC WITH DIFFERENTIAL - Abnormal; Notable for the following:    WBC 15.0 (*)    Hemoglobin 15.2 (*)    Neutrophils Relative % 82 (*)    Neutro Abs 12.3 (*)    Lymphocytes Relative 11 (*)    All other components within normal limits  LIPASE, BLOOD - Abnormal; Notable for the following:    Lipase 2876 (*)    All other components within normal limits  PREGNANCY, URINE  COMPREHENSIVE METABOLIC PANEL  URINE MICROSCOPIC-ADD ON  POC OCCULT BLOOD, ED    Imaging Review Ct Abdomen Pelvis W Contrast  05/02/2014   CLINICAL DATA:  Mid to upper abdominal pain, nausea and vomiting. Leukocytosis. Constipation.  EXAM: CT ABDOMEN AND PELVIS WITH CONTRAST  TECHNIQUE: Multidetector CT imaging of the abdomen and pelvis was performed using the standard protocol following bolus administration of intravenous contrast.  CONTRAST:  36mL OMNIPAQUE IOHEXOL 300 MG/ML  SOLN  COMPARISON:  None.  FINDINGS: The visualized lung bases are clear.  A 2.3 cm cyst is noted at the medial right hepatic lobe. The liver and spleen are otherwise unremarkable in appearance. The gallbladder is within normal limits. The pancreas and adrenal  glands are unremarkable.  The patient has a horseshoe kidney. There is no evidence of hydronephrosis. No renal or ureteral stones are identified. No significant perinephric stranding is seen.  No free fluid is identified. The small bowel is unremarkable in appearance. The stomach is within normal limits. No acute vascular abnormalities are seen.  The appendix is diminutive and grossly unremarkable in appearance, best seen on coronal images. The colon is partially filled with fluid and stool, and is unremarkable in appearance.  The bladder is mildly distended and grossly unremarkable. The uterus is within normal limits. The ovaries are relatively symmetric. No suspicious adnexal masses are seen. No inguinal lymphadenopathy is seen.  No acute osseous abnormalities are identified.  IMPRESSION: 1. No acute abnormality seen within the abdomen or pelvis. 2. Hepatic cyst noted. 3. Horseshoe kidney is grossly unremarkable in appearance.   Electronically Signed   By: Garald Balding M.D.   On: 05/02/2014 23:19     EKG Interpretation None  MDM   Final diagnoses:  Other acute pancreatitis    BP 117/71  Pulse 88  Temp(Src) 98.6 F (37 C) (Oral)  Resp 22  Ht 5\' 2"  (1.575 m)  Wt 158 lb 6.4 oz (71.85 kg)  BMI 28.96 kg/m2  SpO2 100%  I have reviewed nursing notes and vital signs. I personally reviewed the imaging tests through PACS system  I reviewed available ER/hospitalization records thought the EMR     Domenic Moras, [place] 05/03/14 [id number]

## 2014-05-02 NOTE — ED Notes (Signed)
The pt is c/o vomiting since yesterday.  She was seen at [facility] for the same and was given meds no better lmp   bcp

## 2014-05-03 ENCOUNTER — Encounter (HOSPITAL_COMMUNITY): Payer: Self-pay | Admitting: *Deleted

## 2014-05-03 ENCOUNTER — Inpatient Hospital Stay (HOSPITAL_COMMUNITY): Payer: 59

## 2014-05-03 DIAGNOSIS — R112 Nausea with vomiting, unspecified: Secondary | ICD-10-CM

## 2014-05-03 DIAGNOSIS — G43909 Migraine, unspecified, not intractable, without status migrainosus: Secondary | ICD-10-CM | POA: Diagnosis present

## 2014-05-03 DIAGNOSIS — R1013 Epigastric pain: Secondary | ICD-10-CM | POA: Diagnosis present

## 2014-05-03 DIAGNOSIS — K859 Acute pancreatitis without necrosis or infection, unspecified: Principal | ICD-10-CM

## 2014-05-03 DIAGNOSIS — D72829 Elevated white blood cell count, unspecified: Secondary | ICD-10-CM

## 2014-05-03 DIAGNOSIS — Z79899 Other long term (current) drug therapy: Secondary | ICD-10-CM | POA: Diagnosis not present

## 2014-05-03 LAB — CBC
HEMATOCRIT: 38.5 % (ref 36.0–46.0)
HEMOGLOBIN: 13 g/dL (ref 12.0–15.0)
MCH: 32.2 pg (ref 26.0–34.0)
MCHC: 33.8 g/dL (ref 30.0–36.0)
MCV: 95.3 fL (ref 78.0–100.0)
Platelets: 271 10*3/uL (ref 150–400)
RBC: 4.04 MIL/uL (ref 3.87–5.11)
RDW: 12.3 % (ref 11.5–15.5)
WBC: 11.4 10*3/uL — ABNORMAL HIGH (ref 4.0–10.5)

## 2014-05-03 LAB — BASIC METABOLIC PANEL
ANION GAP: 12 (ref 5–15)
BUN: 13 mg/dL (ref 6–23)
CHLORIDE: 105 meq/L (ref 96–112)
CO2: 23 meq/L (ref 19–32)
CREATININE: 0.53 mg/dL (ref 0.50–1.10)
Calcium: 7.7 mg/dL — ABNORMAL LOW (ref 8.4–10.5)
GFR calc Af Amer: >90 mL/min (ref >90)
GFR calc non Af Amer: >90 mL/min (ref >90)
Glucose, Bld: 93 mg/dL (ref 70–99)
POTASSIUM: 3.8 meq/L (ref 3.7–5.3)
Sodium: 140 mEq/L (ref 137–147)

## 2014-05-03 MED ORDER — METOCLOPRAMIDE HCL 5 MG/ML IJ SOLN
10.0000 mg | INTRAMUSCULAR | Status: DC | PRN
  Administered 2014-05-03 (×2): 10 mg via INTRAVENOUS
  Filled 2014-05-03 (×3): qty 2

## 2014-05-03 MED ORDER — PANTOPRAZOLE SODIUM 40 MG IV SOLR
40.0000 mg | Freq: Two times a day (BID) | INTRAVENOUS | Status: DC
  Administered 2014-05-03 – 2014-05-04 (×3): 40 mg via INTRAVENOUS
  Filled 2014-05-03 (×4): qty 40

## 2014-05-03 MED ORDER — HYDROMORPHONE HCL PF 1 MG/ML IJ SOLN
1.0000 mg | INTRAMUSCULAR | Status: DC | PRN
  Administered 2014-05-03: 1 mg via INTRAVENOUS
  Filled 2014-05-03: qty 1

## 2014-05-03 MED ORDER — HYDROMORPHONE HCL PF 1 MG/ML IJ SOLN
0.5000 mg | INTRAMUSCULAR | Status: DC | PRN
  Administered 2014-05-03: 1 mg via INTRAVENOUS
  Filled 2014-05-03: qty 1

## 2014-05-03 MED ORDER — ONDANSETRON HCL 4 MG/2ML IJ SOLN: 4.0000 mg | Freq: Three times a day (TID) | INTRAMUSCULAR | Status: DC | PRN

## 2014-05-03 MED ORDER — SODIUM CHLORIDE 0.9 % IV SOLN
INTRAVENOUS | Status: DC
  Administered 2014-05-03: 1000 mL via INTRAVENOUS
  Administered 2014-05-03 – 2014-05-04 (×2): via INTRAVENOUS

## 2014-05-03 MED ORDER — ONDANSETRON HCL 4 MG/2ML IJ SOLN
4.0000 mg | Freq: Four times a day (QID) | INTRAMUSCULAR | Status: DC | PRN
Start: 2014-05-03 — End: 2014-05-05
  Filled 2014-05-03: qty 2

## 2014-05-03 MED ORDER — ACETAMINOPHEN 650 MG RE SUPP: 650.0000 mg | Freq: Four times a day (QID) | RECTAL | Status: DC | PRN

## 2014-05-03 MED ORDER — ONDANSETRON HCL 4 MG PO TABS: 4.0000 mg | ORAL_TABLET | Freq: Four times a day (QID) | ORAL | Status: DC | PRN

## 2014-05-03 MED ORDER — OXYCODONE HCL 5 MG PO TABS: 5.0000 mg | ORAL_TABLET | ORAL | Status: DC | PRN

## 2014-05-03 MED ORDER — NORETHINDRONE 0.35 MG PO TABS
1.0000 | ORAL_TABLET | Freq: Every day | ORAL | Status: DC
Start: 2014-05-03 — End: 2014-05-03

## 2014-05-03 MED ORDER — NORETHINDRONE 0.35 MG PO TABS
1.0000 | ORAL_TABLET | Freq: Every day | ORAL | Status: DC
  Administered 2014-05-03: 0.35 mg via ORAL

## 2014-05-03 MED ORDER — ACETAMINOPHEN 325 MG PO TABS
650.0000 mg | ORAL_TABLET | Freq: Four times a day (QID) | ORAL | Status: DC | PRN
  Administered 2014-05-03 – 2014-05-04 (×3): 650 mg via ORAL
  Filled 2014-05-03 (×3): qty 2

## 2014-05-03 MED ORDER — PROMETHAZINE HCL 25 MG/ML IJ SOLN: 12.5000 mg | Freq: Four times a day (QID) | INTRAMUSCULAR | Status: DC | PRN

## 2014-05-03 MED ORDER — ENOXAPARIN SODIUM 40 MG/0.4ML ~~LOC~~ SOLN
40.0000 mg | SUBCUTANEOUS | Status: DC
  Administered 2014-05-03: 40 mg via SUBCUTANEOUS
  Filled 2014-05-03 (×3): qty 0.4

## 2014-05-03 NOTE — H&P (Signed)
[facility] Admission History and Physical       Miranda Pena WNU:[id number] DOB: Sep 28, 1975 DOA: 05/02/2014  Referring physician:  EDP PCP: No PCP Per Patient  Specialists:   Chief Complaint:  Nausea and Vomiting and ABD Pain  HPI: Miranda Pena is a 38 y.o. female who presents to the ED with complaints of Nausea and Vomiting and Epigastric ABD PaIn x 1 week and worse today with report of 12 episodes.  She has not been able to hold down foods or liquids.    She denies any hematemesis but she does report having dark stools.  She rates her ABD Pain at a 10/10.   In the ED she was found to have a Lipase level of 2876, and a CT scan of the ABD/Pelveis was performed and was negative for acute findings.    She has had Migraines lately and reports taking increased NSAIDs.      Review of Systems:  Constitutional: No Weight Loss, No Weight Gain, Night Sweats, Fevers, Chills, Dizziness, Fatigue, or Generalized Weakness HEENT:   +Migaine Headaches, Difficulty Swallowing,Tooth/Dental Problems,Sore Throat,  No Sneezing, Rhinitis, Ear Ache, Nasal Congestion, or Post Nasal Drip,  Cardio-vascular:  No Chest pain, Orthopnea, PND, Edema in Lower Extremities, Anasarca, Dizziness, Palpitations  Resp: No Dyspnea, No DOE, No Cough, No Hemoptysis, No Wheezing.    GI: No Heartburn, Indigestion, +Abdominal Pain, +Nausea, +Vomiting, Diarrhea, Hematemesis, Hematochezia, Melena, Change in Bowel Habits,  Loss of Appetite  GU: No Dysuria, Change in Color of Urine, No Urgency or Frequency, No Flank pain.  Musculoskeletal: No Joint Pain or Swelling, No Decreased Range of Motion, No Back Pain.  Neurologic: No Syncope, No Seizures, Muscle Weakness, Paresthesia, Vision Disturbance or Loss, No Diplopia, No Vertigo, No Difficulty Walking,  Skin: No Rash or Lesions. Psych: No Change in Mood or Affect, No Depression or Anxiety, No Memory loss, No Confusion, or Hallucinations   Past Medical History  Diagnosis  Date  . Migraine     History reviewed. No pertinent past surgical history.    Prior to Admission medications   Medication Sig Start Date End Date Taking? Authorizing Provider  BIOTIN PO Take 1 tablet by mouth daily.   Yes Historical Provider, MD  Calcitriol (VECTICAL EX) Apply 1 application topically daily as needed (psorasis ./ itching).   Yes Historical Provider, MD  cholecalciferol (VITAMIN D) 400 UNITS TABS tablet Take 400 Units by mouth daily.   Yes Historical Provider, MD  MAGNESIUM PO Take 1 tablet by mouth daily.   Yes Historical Provider, MD  Multiple Vitamin (MULTI VITAMIN DAILY PO) Take 1 tablet by mouth daily.   Yes Historical Provider, MD  norethindrone (MICRONOR,CAMILA,ERRIN) 0.35 MG tablet Take 1 tablet by mouth daily.   Yes Historical Provider, MD  Omega-3 Fatty Acids (FISH OIL PO) Take 1 capsule by mouth daily.   Yes Historical Provider, MD  ondansetron (ZOFRAN) 8 MG tablet Take 8 mg by mouth every 8 (eight) hours as needed for nausea or vomiting.   Yes Historical Provider, MD  pantoprazole (PROTONIX) 40 MG tablet Take 40 mg by mouth daily.   Yes Historical Provider, MD    No Known Allergies   Social History:  reports that she has never smoked. She has never used smokeless tobacco. She reports that she does not drink alcohol or use illicit drugs.     Family History  Problem Relation Age of Onset  . Thyroid disease Father   . Thyroid disease Mother   . Migraines  Sister   . Diverticulitis Sister        Physical Exam:  GEN:  Pleasant Well Nourished and Well Developed  38 y.o. Caucasian female examined  and in no acute distress; cooperative with exam Filed Vitals:   05/02/14 2311 05/02/14 2315 05/02/14 2330 05/02/14 2345  BP: 126/76 124/75 126/69 117/71  Pulse: 91 85 91 88  Temp:      TempSrc:      Resp: 20 15 16 22   Height:      Weight:      SpO2: 100% 100% 100% 100%   Blood pressure 117/71, pulse 88, temperature 98.6 F (37 C), temperature source Oral,  resp. rate 22, height 5\' 2"  (1.575 m), weight 71.85 kg (158 lb 6.4 oz), SpO2 100.00%. PSYCH: She is alert and oriented x4; does not appear anxious does not appear depressed; affect is normal HEENT: Normocephalic and Atraumatic, Mucous membranes pink; PERRLA; EOM intact; Fundi:  Benign;  No scleral icterus, Nares: Patent, Oropharynx: Clear, Fair Dentition,    Neck:  FROM, No Cervical Lymphadenopathy nor Thyromegaly or Carotid Bruit; No JVD; Breasts:: Not examined CHEST WALL: No tenderness CHEST: Normal respiration, clear to auscultation bilaterally HEART: Regular rate and rhythm; no murmurs rubs or gallops BACK: No kyphosis or scoliosis; No CVA tenderness ABDOMEN: Positive Bowel Sounds, Soft, Mildly tender in the Epigastrium,  No Masses, No Organomegaly. Rectal Exam: Not done EXTREMITIES: No Cyanosis, Clubbing, or Edema; No Ulcerations. Genitalia: not examined PULSES: 2+ and symmetric SKIN: Normal hydration no rash or ulceration CNS:  Alert and Oriented x 4, No Focal Deficits  Vascular: pulses palpable throughout    Labs on Admission:  Basic Metabolic Panel:  Recent Labs Lab 05/02/14 1955  NA 139  K 3.8  CL 98  CO2 26  GLUCOSE 98  BUN 17  CREATININE 0.56  CALCIUM 8.9   Liver Function Tests:  Recent Labs Lab 05/02/14 1955  AST 20  ALT 17  ALKPHOS 47  BILITOT 0.4  PROT 7.7  ALBUMIN 4.1    Recent Labs Lab 05/02/14 1955  LIPASE 2876*   No results found for this basename: AMMONIA,  in the last 168 hours CBC:  Recent Labs Lab 05/02/14 1955  WBC 15.0*  NEUTROABS 12.3*  HGB 15.2*  HCT 43.3  MCV 91.4  PLT 363   Cardiac Enzymes: No results found for this basename: CKTOTAL, CKMB, CKMBINDEX, TROPONINI,  in the last 168 hours  BNP (last 3 results) No results found for this basename: PROBNP,  in the last 8760 hours CBG: No results found for this basename: GLUCAP,  in the last 168 hours  Radiological Exams on Admission: Ct Abdomen Pelvis W  Contrast  05/02/2014   CLINICAL DATA:  Mid to upper abdominal pain, nausea and vomiting. Leukocytosis. Constipation.  EXAM: CT ABDOMEN AND PELVIS WITH CONTRAST  TECHNIQUE: Multidetector CT imaging of the abdomen and pelvis was performed using the standard protocol following bolus administration of intravenous contrast.  CONTRAST:  52mL OMNIPAQUE IOHEXOL 300 MG/ML  SOLN  COMPARISON:  None.  FINDINGS: The visualized lung bases are clear.  A 2.3 cm cyst is noted at the medial right hepatic lobe. The liver and spleen are otherwise unremarkable in appearance. The gallbladder is within normal limits. The pancreas and adrenal glands are unremarkable.  The patient has a horseshoe kidney. There is no evidence of hydronephrosis. No renal or ureteral stones are identified. No significant perinephric stranding is seen.  No free fluid is identified. The small bowel is  unremarkable in appearance. The stomach is within normal limits. No acute vascular abnormalities are seen.  The appendix is diminutive and grossly unremarkable in appearance, best seen on coronal images. The colon is partially filled with fluid and stool, and is unremarkable in appearance.  The bladder is mildly distended and grossly unremarkable. The uterus is within normal limits. The ovaries are relatively symmetric. No suspicious adnexal masses are seen. No inguinal lymphadenopathy is seen.  No acute osseous abnormalities are identified.  IMPRESSION: 1. No acute abnormality seen within the abdomen or pelvis. 2. Hepatic cyst noted. 3. Horseshoe kidney is grossly unremarkable in appearance.   Electronically Signed   By: Garald Balding M.D.   On: 05/02/2014 23:19      Assessment/Plan:   38 y.o. female with  Principal Problem:   1.   Acute pancreatitis   NPO except for Sips and Ice Chips   Pain Control and Anti-emetics PRN   IVFs   Consult GI In AM for Possible ERCP of MRCP   Active Problems:   2.   Nausea and vomiting- Due to #1     3.    Epigastric abdominal pain- Due to #1       4.   Leukocytosis- Due to #1   Monitor Trend     5.   DVT Prophylaxis    Lovenox     Code Status:  FULL CODE   Family Communication:   Family at Bedside Disposition Plan:    Inpatient  Time spent:  10 minutes  [facility] Pager [phone]   If [place] Please Contact the Day Rounding Team MD for [facility]  If 7PM-7AM, Please Contact night-coverage  [email or web address] Password [email or web address] 05/03/2014, 12:56 AM

## 2014-05-03 NOTE — Progress Notes (Signed)
Received report from Maudie Mercury, ED RN.

## 2014-05-03 NOTE — ED Provider Notes (Signed)
Medical screening examination/treatment/procedure(s) were conducted as a shared visit with non-physician practitioner(s) and myself.  I personally evaluated the patient during the encounter.   EKG Interpretation None       Orlie Dakin, MD 05/03/14 [phone]

## 2014-05-03 NOTE — ED Provider Notes (Signed)
Patient reports approximately 12 episodes of vomiting today. She also reports epigastric pain for possibly one week. She feels much improved since treatment in the emergency part  Orlie Dakin, MD 05/03/14 [phone]

## 2014-05-03 NOTE — ED Notes (Signed)
Pt reports constant nausea, vomiting, heart burn x 3 days. States went to Urgent Care in [place] and received GI cocktail which helped for awhile and zofran tablets, with no relief of ehr nausea. Came to the ER for further evaluation. States she has thrown up at least 10 times today and had tarry stool x 3 days.

## 2014-05-03 NOTE — Progress Notes (Signed)
PROGRESS NOTE  Jaysa Kise GLO:[id number] DOB: 10-Apr-1976 DOA: 05/02/2014 PCP: No PCP Per Patient  Assessment/Plan: Acute pancreatitis - trend lipase NPO except for Sips and Ice Chips  Pain Control and Anti-emetics PRN  IVFs  U/S of abd: consult GI as may need MRCP or ERCP  ? Need for autoimmune work up- denies alcohol -liver enzymes not elevated  Nausea and vomiting- Due to #1    Epigastric abdominal pain- Due to #1   Leukocytosis- Due to #1  Monitor Trend   Code Status: full Family Communication: patient Disposition Plan:    Consultants:  none  Procedures:     HPI/Subjective: C/o nausea not better with reglan or zofran  Objective: Filed Vitals:   05/03/14 1004  BP: 104/71  Pulse: 90  Temp: 98.4 F (36.9 C)  Resp: 16    Intake/Output Summary (Last 24 hours) at 05/03/14 1009 Last data filed at 05/03/14 0723  Gross per 24 hour  Intake    590 ml  Output    120 ml  Net    470 ml   Filed Weights   05/02/14 1949 05/03/14 0121  Weight: 71.85 kg (158 lb 6.4 oz) 73.5 kg (162 lb 0.6 oz)    Exam:   General:  A+Ox3, NAD  Cardiovascular: rrr  Respiratory: clear  Abdomen: +BS, tender to palpation  Musculoskeletal: no edema   Data Reviewed: Basic Metabolic Panel:  Recent Labs Lab 05/02/14 1955 05/03/14 0535  NA 139 140  K 3.8 3.8  CL 98 105  CO2 26 23  GLUCOSE 98 93  BUN 17 13  CREATININE 0.56 0.53  CALCIUM 8.9 7.7*   Liver Function Tests:  Recent Labs Lab 05/02/14 1955  AST 20  ALT 17  ALKPHOS 47  BILITOT 0.4  PROT 7.7  ALBUMIN 4.1    Recent Labs Lab 05/02/14 1955  LIPASE 2876*   No results found for this basename: AMMONIA,  in the last 168 hours CBC:  Recent Labs Lab 05/02/14 1955 05/03/14 0535  WBC 15.0* 11.4*  NEUTROABS 12.3*  --   HGB 15.2* 13.0  HCT 43.3 38.5  MCV 91.4 95.3  PLT 363 271   Cardiac Enzymes: No results found for this basename: CKTOTAL, CKMB, CKMBINDEX, TROPONINI,  in the last 168  hours BNP (last 3 results) No results found for this basename: PROBNP,  in the last 8760 hours CBG: No results found for this basename: GLUCAP,  in the last 168 hours  No results found for this or any previous visit (from the past 240 hour(s)).   Studies: Ct Abdomen Pelvis W Contrast  05/02/2014   CLINICAL DATA:  Mid to upper abdominal pain, nausea and vomiting. Leukocytosis. Constipation.  EXAM: CT ABDOMEN AND PELVIS WITH CONTRAST  TECHNIQUE: Multidetector CT imaging of the abdomen and pelvis was performed using the standard protocol following bolus administration of intravenous contrast.  CONTRAST:  83mL OMNIPAQUE IOHEXOL 300 MG/ML  SOLN  COMPARISON:  None.  FINDINGS: The visualized lung bases are clear.  A 2.3 cm cyst is noted at the medial right hepatic lobe. The liver and spleen are otherwise unremarkable in appearance. The gallbladder is within normal limits. The pancreas and adrenal glands are unremarkable.  The patient has a horseshoe kidney. There is no evidence of hydronephrosis. No renal or ureteral stones are identified. No significant perinephric stranding is seen.  No free fluid is identified. The small bowel is unremarkable in appearance. The stomach is within normal limits. No acute vascular abnormalities are  seen.  The appendix is diminutive and grossly unremarkable in appearance, best seen on coronal images. The colon is partially filled with fluid and stool, and is unremarkable in appearance.  The bladder is mildly distended and grossly unremarkable. The uterus is within normal limits. The ovaries are relatively symmetric. No suspicious adnexal masses are seen. No inguinal lymphadenopathy is seen.  No acute osseous abnormalities are identified.  IMPRESSION: 1. No acute abnormality seen within the abdomen or pelvis. 2. Hepatic cyst noted. 3. Horseshoe kidney is grossly unremarkable in appearance.   Electronically Signed   By: Garald Balding M.D.   On: 05/02/2014 23:19    Scheduled  Meds: . enoxaparin (LOVENOX) injection  40 mg Subcutaneous Q24H  . norethindrone  1 tablet Oral Daily  . pantoprazole (PROTONIX) IV  40 mg Intravenous Q12H   Continuous Infusions: . sodium chloride 100 mL/hr at 05/03/14 0129   Antibiotics Given (last 72 hours)   None      Principal Problem:   Acute pancreatitis Active Problems:   Nausea and vomiting   Epigastric abdominal pain   Leukocytosis    Time spent: 35 min    Mose Colaizzi  [facility] Pager [phone]. If 7PM-7AM, please contact night-coverage at [email or web address], password [facility] 05/03/2014, 10:09 AM  LOS: 1 day

## 2014-05-03 NOTE — Progress Notes (Signed)
Attempted to receive report again. Left message with [facility]. RN will return call.

## 2014-05-03 NOTE — Progress Notes (Signed)
Attempted to receive report from ED. No answer. Will continue to call for report.

## 2014-05-04 ENCOUNTER — Inpatient Hospital Stay (HOSPITAL_COMMUNITY): Payer: 59

## 2014-05-04 LAB — CBC
HEMATOCRIT: 39.6 % (ref 36.0–46.0)
HEMATOCRIT: 36.7 % (ref 36.0–46.0)
Hemoglobin: 13.6 g/dL (ref 12.0–15.0)
Hemoglobin: 12.8 g/dL (ref 12.0–15.0)
MCH: 32.5 pg (ref 26.0–34.0)
MCH: 32.3 pg (ref 26.0–34.0)
MCHC: 34.3 g/dL (ref 30.0–36.0)
MCHC: 34.9 g/dL (ref 30.0–36.0)
MCV: 94.7 fL (ref 78.0–100.0)
MCV: 92.7 fL (ref 78.0–100.0)
Platelets: 276 10*3/uL (ref 150–400)
Platelets: 287 10*3/uL (ref 150–400)
RBC: 4.18 MIL/uL (ref 3.87–5.11)
RBC: 3.96 MIL/uL (ref 3.87–5.11)
RDW: 12.2 % (ref 11.5–15.5)
RDW: 12.1 % (ref 11.5–15.5)
WBC: 7.3 10*3/uL (ref 4.0–10.5)
WBC: 6.6 10*3/uL (ref 4.0–10.5)

## 2014-05-04 LAB — LIPID PANEL
CHOLESTEROL: 191 mg/dL (ref 0–200)
HDL: 54 mg/dL (ref >39)
LDL Cholesterol: 116 mg/dL — ABNORMAL HIGH (ref 0–99)
TRIGLYCERIDES: 107 mg/dL (ref <150)
Total CHOL/HDL Ratio: 3.5 RATIO
VLDL: 21 mg/dL (ref 0–40)

## 2014-05-04 LAB — COMPREHENSIVE METABOLIC PANEL
ALBUMIN: 3.4 g/dL — AB (ref 3.5–5.2)
ALT: 13 U/L (ref 0–35)
AST: 16 U/L (ref 0–37)
Alkaline Phosphatase: 40 U/L (ref 39–117)
Anion gap: 17 — ABNORMAL HIGH (ref 5–15)
BUN: 9 mg/dL (ref 6–23)
CALCIUM: 8.4 mg/dL (ref 8.4–10.5)
CO2: 17 mEq/L — ABNORMAL LOW (ref 19–32)
CREATININE: 0.57 mg/dL (ref 0.50–1.10)
Chloride: 106 mEq/L (ref 96–112)
GFR calc Af Amer: >90 mL/min (ref >90)
GFR calc non Af Amer: >90 mL/min (ref >90)
Glucose, Bld: 63 mg/dL — ABNORMAL LOW (ref 70–99)
Potassium: 3.7 mEq/L (ref 3.7–5.3)
Sodium: 140 mEq/L (ref 137–147)
Total Bilirubin: 0.3 mg/dL (ref 0.3–1.2)
Total Protein: 6.4 g/dL (ref 6.0–8.3)

## 2014-05-04 LAB — LIPASE, BLOOD: LIPASE: 37 U/L (ref 11–59)

## 2014-05-04 MED ORDER — PANTOPRAZOLE SODIUM 40 MG PO TBEC
40.0000 mg | DELAYED_RELEASE_TABLET | Freq: Every day | ORAL | Status: DC
  Filled 2014-05-04: qty 1

## 2014-05-04 MED ORDER — PROMETHAZINE HCL 25 MG PO TABS: 12.5000 mg | ORAL_TABLET | Freq: Four times a day (QID) | ORAL | Status: DC | PRN

## 2014-05-04 NOTE — Progress Notes (Addendum)
PROGRESS NOTE  Miranda Pena MEQ:[id number] DOB: 01-10-1976 DOA: 05/02/2014 PCP: No PCP Per Patient  Assessment/Plan: Acute pancreatitis - lipase back to normal Clears, advance as tolerated  Pain Control and Anti-emetics PRN  IVFs- d/c U/S of abd: no stones - denies alcohol -new birth control started last Monday-- could be this medication -liver enzymes not elevated - discussed with Dr. [name]: will follow as outpatient- ? Medication vs autoimmune process (1st episode) vs viral -await FLP  -incidental hepatic cyst -horseshoe kidney  Nausea and vomiting- resolved   Epigastric abdominal pain- resolved  Leukocytosis-resolved    Code Status: full Family Communication: patient/husband Disposition Plan: home in AM   Consultants:  none  Procedures:     HPI/Subjective: Feeling much better  Objective: Filed Vitals:   05/04/14 0348  BP: 114/71  Pulse: 80  Temp: 98.1 F (36.7 C)  Resp: 16    Intake/Output Summary (Last 24 hours) at 05/04/14 1012 Last data filed at 05/04/14 0339  Gross per 24 hour  Intake    640 ml  Output    600 ml  Net     40 ml   Filed Weights   05/02/14 1949 05/03/14 0121  Weight: 71.85 kg (158 lb 6.4 oz) 73.5 kg (162 lb 0.6 oz)    Exam:   General:  A+Ox3, NAD  Cardiovascular: rrr  Respiratory: clear  Abdomen: +BS, tender to palpation  Musculoskeletal: no edema   Data Reviewed: Basic Metabolic Panel:  Recent Labs Lab 05/02/14 1955 05/03/14 0535 05/04/14 0700  NA 139 140 140  K 3.8 3.8 3.7  CL 98 105 106  CO2 26 23 17*  GLUCOSE 98 93 63*  BUN 17 13 9   CREATININE 0.56 0.53 0.57  CALCIUM 8.9 7.7* 8.4   Liver Function Tests:  Recent Labs Lab 05/02/14 1955 05/04/14 0700  AST 20 16  ALT 17 13  ALKPHOS 47 40  BILITOT 0.4 0.3  PROT 7.7 6.4  ALBUMIN 4.1 3.4*    Recent Labs Lab 05/02/14 1955 05/04/14 0700  LIPASE 2876* 37   No results found for this basename: AMMONIA,  in the last 168  hours CBC:  Recent Labs Lab 05/02/14 1955 05/03/14 0535 05/04/14 0700  WBC 15.0* 11.4* 7.3  NEUTROABS 12.3*  --   --   HGB 15.2* 13.0 13.6  HCT 43.3 38.5 39.6  MCV 91.4 95.3 94.7  PLT 363 271 276   Cardiac Enzymes: No results found for this basename: CKTOTAL, CKMB, CKMBINDEX, TROPONINI,  in the last 168 hours BNP (last 3 results) No results found for this basename: PROBNP,  in the last 8760 hours CBG: No results found for this basename: GLUCAP,  in the last 168 hours  No results found for this or any previous visit (from the past 240 hour(s)).   Studies: Ct Abdomen Pelvis W Contrast  05/02/2014   CLINICAL DATA:  Mid to upper abdominal pain, nausea and vomiting. Leukocytosis. Constipation.  EXAM: CT ABDOMEN AND PELVIS WITH CONTRAST  TECHNIQUE: Multidetector CT imaging of the abdomen and pelvis was performed using the standard protocol following bolus administration of intravenous contrast.  CONTRAST:  19mL OMNIPAQUE IOHEXOL 300 MG/ML  SOLN  COMPARISON:  None.  FINDINGS: The visualized lung bases are clear.  A 2.3 cm cyst is noted at the medial right hepatic lobe. The liver and spleen are otherwise unremarkable in appearance. The gallbladder is within normal limits. The pancreas and adrenal glands are unremarkable.  The patient has a horseshoe kidney. There is  no evidence of hydronephrosis. No renal or ureteral stones are identified. No significant perinephric stranding is seen.  No free fluid is identified. The small bowel is unremarkable in appearance. The stomach is within normal limits. No acute vascular abnormalities are seen.  The appendix is diminutive and grossly unremarkable in appearance, best seen on coronal images. The colon is partially filled with fluid and stool, and is unremarkable in appearance.  The bladder is mildly distended and grossly unremarkable. The uterus is within normal limits. The ovaries are relatively symmetric. No suspicious adnexal masses are seen. No inguinal  lymphadenopathy is seen.  No acute osseous abnormalities are identified.  IMPRESSION: 1. No acute abnormality seen within the abdomen or pelvis. 2. Hepatic cyst noted. 3. Horseshoe kidney is grossly unremarkable in appearance.   Electronically Signed   By: Garald Balding M.D.   On: 05/02/2014 23:19   US Abdomen Limited Ruq  05/04/2014   CLINICAL DATA:  Elevated lipase  EXAM: US ABDOMEN LIMITED - RIGHT UPPER QUADRANT  COMPARISON:  CT 05/02/2014  FINDINGS: Gallbladder:  No gallstones or wall thickening visualized. No sonographic Murphy sign noted.  Common bile duct:  Diameter: Normal at 3 mm  Liver:  Anechoic 2 cm multilobulated cyst in the central liver which corresponds to benign cyst comparison CT. No biliary duct dilatation. Normal parenchymal echogenicity.  IMPRESSION: 1. No acute findings in the abdomen by ultrasound. 2. Benign hepatic cyst.   Electronically Signed   By: Suzy Bouchard M.D.   On: 05/04/2014 09:17    Scheduled Meds: . enoxaparin (LOVENOX) injection  40 mg Subcutaneous Q24H  . pantoprazole (PROTONIX) IV  40 mg Intravenous Q12H   Continuous Infusions: . sodium chloride 100 mL/hr at 05/04/14 6606   Antibiotics Given (last 72 hours)   None      Principal Problem:   Acute pancreatitis Active Problems:   Nausea and vomiting   Epigastric abdominal pain   Leukocytosis    Time spent: 25 min    VANN, JESSICA  [facility] Pager [phone]. If 7PM-7AM, please contact night-coverage at [email or web address], password [facility] 05/04/2014, 10:12 AM  LOS: 2 days

## 2014-05-04 NOTE — Progress Notes (Signed)
Pt c/o small amount  of blood streak coming with urine occasionally since yesterday. Endorsed to oncoming nurse to report to MD.

## 2014-05-05 LAB — BASIC METABOLIC PANEL
Anion gap: 11 (ref 5–15)
BUN: 6 mg/dL (ref 6–23)
CO2: 23 mEq/L (ref 19–32)
CREATININE: 0.55 mg/dL (ref 0.50–1.10)
Calcium: 8.8 mg/dL (ref 8.4–10.5)
Chloride: 105 mEq/L (ref 96–112)
GFR calc non Af Amer: >90 mL/min (ref >90)
Glucose, Bld: 86 mg/dL (ref 70–99)
Potassium: 3.8 mEq/L (ref 3.7–5.3)
Sodium: 139 mEq/L (ref 137–147)

## 2014-05-05 NOTE — Progress Notes (Signed)
Utilization review completed.  

## 2014-05-05 NOTE — Discharge Summary (Signed)
Physician Discharge Summary  Miranda Pena EHU:[id number] DOB: [birth year] DOA: 05/02/2014  PCP: Miranda Casco, MD  Admit date: 05/02/2014 Discharge date: 05/05/2014  Time spent: greater than 30 minutes  Recommendations for Outpatient Follow-up:  1. GI to decide further workup  Discharge Diagnoses:  Principal Problem:   Acute pancreatitis  Discharge Condition: stable  Filed Weights   05/02/14 1949 05/03/14 0121  Weight: 71.85 kg (158 lb 6.4 oz) 73.5 kg (162 lb 0.6 oz)    History of present illness:  38 y.o. female who presents to the ED with complaints of Nausea and Vomiting and Epigastric ABD PaIn x 1 week and worse today with report of 12 episodes. She has not been able to hold down foods or liquids. She denies any hematemesis but she does report having dark stools. She rates her ABD Pain at a 10/10. In the ED she was found to have a Lipase level of 2876, and a CT scan of the ABD/Pelveis was performed and was negative for acute findings. She has had Migraines lately and reports taking increased NSAIDs.   Hospital Course:  Admitted to floor. Started on bowel rest, pain and nausea medication. Ultrasound showed no stones. Triglycerides normal. Denies heavy alcohol use.  Dr. [name] discussed the case with Dr. [name], who recommends outpatient GI follow up to consider MRCP and/or endoscopic ultrasound. Pain and nausea improved quickly. By discharge, tolerating solid diet without pain or nausea.  Procedures:  none  Consultations:  none  Discharge Exam: Filed Vitals:   05/05/14 0501  BP: 99/63  Pulse: 74  Temp: 98.1 F (36.7 C)  Resp: 16    General: comfortable Cardiovascular: RRR without MGR Respiratory: CTA without WRR abd s, nt, nd   Discharge Instructions   Activity as tolerated - No restrictions    Complete by:  As directed      Discharge instructions    Complete by:  As directed   Low fat diet for 1-2 weeks            Medication List    STOP taking  these medications       norethindrone 0.35 MG tablet  Commonly known as:  MICRONOR,CAMILA,ERRIN     pantoprazole 40 MG tablet  Commonly known as:  PROTONIX      TAKE these medications       BIOTIN PO  Take 1 tablet by mouth daily.     cholecalciferol 400 UNITS Tabs tablet  Commonly known as:  VITAMIN D  Take 400 Units by mouth daily.     FISH OIL PO  Take 1 capsule by mouth daily.     MAGNESIUM PO  Take 1 tablet by mouth daily.     MULTI VITAMIN DAILY PO  Take 1 tablet by mouth daily.     ondansetron 8 MG tablet  Commonly known as:  ZOFRAN  Take 8 mg by mouth every 8 (eight) hours as needed for nausea or vomiting.     VECTICAL EX  Apply 1 application topically daily as needed (psorasis ./ itching).       No Known Allergies     Follow-up Information   Follow up with [name], MD. (et al.)    Specialty:  Gastroenterology   Contact information:   [address] [address] [phone]        The results of significant diagnostics from this hospitalization (including imaging, microbiology, ancillary and laboratory) are listed below for reference.    Significant Diagnostic Studies:  Ct Abdomen Pelvis W Contrast  05/02/2014   CLINICAL DATA:  Mid to upper abdominal pain, nausea and vomiting. Leukocytosis. Constipation.  EXAM: CT ABDOMEN AND PELVIS WITH CONTRAST  TECHNIQUE: Multidetector CT imaging of the abdomen and pelvis was performed using the standard protocol following bolus administration of intravenous contrast.  CONTRAST:  18mL OMNIPAQUE IOHEXOL 300 MG/ML  SOLN  COMPARISON:  None.  FINDINGS: The visualized lung bases are clear.  A 2.3 cm cyst is noted at the medial right hepatic lobe. The liver and spleen are otherwise unremarkable in appearance. The gallbladder is within normal limits. The pancreas and adrenal glands are unremarkable.  The patient has a horseshoe kidney. There is no evidence of hydronephrosis. No renal or ureteral stones are  identified. No significant perinephric stranding is seen.  No free fluid is identified. The small bowel is unremarkable in appearance. The stomach is within normal limits. No acute vascular abnormalities are seen.  The appendix is diminutive and grossly unremarkable in appearance, best seen on coronal images. The colon is partially filled with fluid and stool, and is unremarkable in appearance.  The bladder is mildly distended and grossly unremarkable. The uterus is within normal limits. The ovaries are relatively symmetric. No suspicious adnexal masses are seen. No inguinal lymphadenopathy is seen.  No acute osseous abnormalities are identified.  IMPRESSION: 1. No acute abnormality seen within the abdomen or pelvis. 2. Hepatic cyst noted. 3. Horseshoe kidney is grossly unremarkable in appearance.   Electronically Signed   By: Garald Balding M.D.   On: 05/02/2014 23:19   US Abdomen Limited Ruq  05/04/2014   CLINICAL DATA:  Elevated lipase  EXAM: US ABDOMEN LIMITED - RIGHT UPPER QUADRANT  COMPARISON:  CT 05/02/2014  FINDINGS: Gallbladder:  No gallstones or wall thickening visualized. No sonographic Murphy sign noted.  Common bile duct:  Diameter: Normal at 3 mm  Liver:  Anechoic 2 cm multilobulated cyst in the central liver which corresponds to benign cyst comparison CT. No biliary duct dilatation. Normal parenchymal echogenicity.  IMPRESSION: 1. No acute findings in the abdomen by ultrasound. 2. Benign hepatic cyst.   Electronically Signed   By: Suzy Bouchard M.D.   On: 05/04/2014 09:17    Microbiology: No results found for this or any previous visit (from the past 240 hour(s)).   Labs: Basic Metabolic Panel:  Recent Labs Lab 05/02/14 1955 05/03/14 0535 05/04/14 0700 05/04/14 2329  NA 139 140 140 139  K 3.8 3.8 3.7 3.8  CL 98 105 106 105  CO2 26 23 17* 23  GLUCOSE 98 93 63* 86  BUN 17 13 9 6   CREATININE 0.56 0.53 0.57 0.55  CALCIUM 8.9 7.7* 8.4 8.8   Liver Function Tests:  Recent  Labs Lab 05/02/14 1955 05/04/14 0700  AST 20 16  ALT 17 13  ALKPHOS 47 40  BILITOT 0.4 0.3  PROT 7.7 6.4  ALBUMIN 4.1 3.4*    Recent Labs Lab 05/02/14 1955 05/04/14 0700  LIPASE 2876* 37   No results found for this basename: AMMONIA,  in the last 168 hours CBC:  Recent Labs Lab 05/02/14 1955 05/03/14 0535 05/04/14 0700 05/04/14 2329  WBC 15.0* 11.4* 7.3 6.6  NEUTROABS 12.3*  --   --   --   HGB 15.2* 13.0 13.6 12.8  HCT 43.3 38.5 39.6 36.7  MCV 91.4 95.3 94.7 92.7  PLT 363 271 276 287   Cardiac Enzymes: No results found for this basename: CKTOTAL, CKMB, CKMBINDEX, TROPONINI,  in the last 168 hours BNP: BNP (last 3 results) No results found for this basename: PROBNP,  in the last 8760 hours CBG: No results found for this basename: GLUCAP,  in the last 168 hours  Signed:  Privateer L  [facility] 05/05/2014, 11:09 AM

## 2014-05-05 NOTE — Progress Notes (Signed)
Maryanna Shape to be D/C'd Home per MD order.  Discussed with the patient and all questions fully answered.    Medication List    STOP taking these medications       norethindrone 0.35 MG tablet  Commonly known as:  MICRONOR,CAMILA,ERRIN     pantoprazole 40 MG tablet  Commonly known as:  PROTONIX      TAKE these medications       BIOTIN PO  Take 1 tablet by mouth daily.     cholecalciferol 400 UNITS Tabs tablet  Commonly known as:  VITAMIN D  Take 400 Units by mouth daily.     FISH OIL PO  Take 1 capsule by mouth daily.     MAGNESIUM PO  Take 1 tablet by mouth daily.     MULTI VITAMIN DAILY PO  Take 1 tablet by mouth daily.     ondansetron 8 MG tablet  Commonly known as:  ZOFRAN  Take 8 mg by mouth every 8 (eight) hours as needed for nausea or vomiting.     VECTICAL EX  Apply 1 application topically daily as needed (psorasis ./ itching).        VVS, Skin clean, dry and intact without evidence of skin break down, no evidence of skin tears noted. IV catheter discontinued intact. Site without signs and symptoms of complications. Dressing and pressure applied.  An After Visit Summary was printed and given to the patient. Patient escorted via [place], and D/C home via private auto.  Elias Else D 05/05/2014 11:14 AM

## 2014-05-07 ENCOUNTER — Encounter: Payer: Self-pay | Admitting: Physician Assistant

## 2014-05-15 ENCOUNTER — Ambulatory Visit (INDEPENDENT_AMBULATORY_CARE_PROVIDER_SITE_OTHER): Payer: 59 | Admitting: Physician Assistant

## 2014-05-15 ENCOUNTER — Other Ambulatory Visit: Payer: 59

## 2014-05-15 ENCOUNTER — Encounter: Payer: Self-pay | Admitting: Physician Assistant

## 2014-05-15 VITALS — BP 100/70 | HR 76 | Ht 61.75 in | Wt 157.4 lb

## 2014-05-15 DIAGNOSIS — K858 Other acute pancreatitis without necrosis or infection: Secondary | ICD-10-CM

## 2014-05-15 DIAGNOSIS — K859 Acute pancreatitis without necrosis or infection, unspecified: Secondary | ICD-10-CM

## 2014-05-15 NOTE — Patient Instructions (Signed)
Please go to the basement level to have your labs drawn.  You have been scheduled for an MRCP at [facility] on 05-30-2014. Your appointment time is 1:00 PM . Please arrive 15 minutes prior to your appointment time for registration purposes. Please make certain not to have anything to eat or drink 4 hours prior to your test. In addition, if you have any metal in your body, have a pacemaker or defibrillator, please be sure to let your ordering physician know. This test typically takes 45 minutes to 1 hour to complete.  Follow up with Dr. Carlean Purl as needed.

## 2014-05-15 NOTE — Progress Notes (Signed)
Subjective:    Patient ID: Miranda Pena, female    DOB: [birth year], 38 y.o.   MRN: [id number]  HPI  Miranda Pena is a pleasant 38 year old white female generally in good health who is referred to GI after recent hospitalization on 8:15 through 05/05/2014 with an acute pancreatitis. She was on the hospitalist service and was not seen by GI during her hospitalization. She states that she had developed acute epigastric pain and nausea on the day of heard mission which was associated with vomiting. Pain was burning in nature and nonradiating. It progressed during the day. On admission she had a lipase of 2876 and within 48 hours her lipase was back down to normal at 37. She had a leukocytosis which resolved quickly. Urine hCG was negative. Liver tests were normal. Upper abdominal ultrasound  Showed common bile duct of 3 mm ,no gallstones noted- pancreas appeared normal she had a 2 cm benign hepatic cyst noted. CT scan of the abdomen and pelvis was also done and showed a horseshoe kidney and the 2 cm hepatic cyst which appears benign. Pancreas was unremarkable. She had a lipid panel done with normal triglycerides. Patient mentions that her birth control pills were questioned. She had actually switched from Loestrin  FE to norethidrone birth control pill about a week before this acute episode. She does not drink any alcohol and a regular basis and had not had any alcohol for couple of weeks prior to her episode. There is no family history of pancreatic or liver disease. Patient notes that she has had flushing of her skin evidence and she was a child. She says whenever she is in any kind of stressful situation she will have flushing of her face and extremities and is flushed today. She has never been diagnosed with Raynauds or any other autoimmune disorder.     Review of Systems  Constitutional: Negative.   HENT: Negative.   Eyes: Negative.   Respiratory: Negative.   Cardiovascular: Negative.     Gastrointestinal: Negative.   Endocrine: Negative.   Genitourinary: Negative.   Skin: Negative.   Allergic/Immunologic: Negative.   Neurological: Negative.   Hematological: Negative.   Psychiatric/Behavioral: Negative.    Outpatient Prescriptions Prior to Visit  Medication Sig Dispense Refill  . BIOTIN PO Take 1 tablet by mouth daily.      . Calcitriol (VECTICAL EX) Apply 1 application topically daily as needed (psorasis ./ itching).      . cholecalciferol (VITAMIN D) 400 UNITS TABS tablet Take 400 Units by mouth daily.      Marland Kitchen MAGNESIUM PO Take 1 tablet by mouth daily.      . Multiple Vitamin (MULTI VITAMIN DAILY PO) Take 1 tablet by mouth daily.      . Omega-3 Fatty Acids (FISH OIL PO) Take 1 capsule by mouth daily.      . ondansetron (ZOFRAN) 8 MG tablet Take 8 mg by mouth every 8 (eight) hours as needed for nausea or vomiting.       No facility-administered medications prior to visit.   No Known Allergies Patient Active Problem List   Diagnosis Date Noted  . Acute pancreatitis 05/03/2014       History  Substance Use Topics  . Smoking status: Never Smoker   . Smokeless tobacco: Never Used  . Alcohol Use: No   family history includes Diverticulitis in her sister; Migraines in her sister; Thyroid disease in her father and mother.  Objective:   Physical Exam  well-developed white female  in no acute distress, pleasant blood pressure 100/70 pulse 76 height 5 foot 1 weight 157. HEENT; nontraumatic normocephalic EOMI PERRLA sclera anicteric, Cardiovascular; regular rate and rhythm with S1-S2 no murmur or gallop, Pulmonary; clear bilaterally, Abdomen; soft nontender nondistended bowel sounds are active there is no palpable mass or hepatosplenomegaly, Rectal; exam not done, Extremities; she has rather diffuse flushing of her face upper acuity is and lower extremities which blanches with pressure, Psych; mood and affect normal and appropriate      Assessment & Plan:  #64   38 year old female with recent episode of acute mild pancreatitis for which she was hospitalized for 48 hours. Etiology is unclear with no evidence for gallbladder disease, choledocholithiasis, hyperlipidemia, or EtOH use. Birth control pills very unlikely to be a cause of acute pancreatitis but not impossible Rule out pancreas ,divisum, rule out autoimmune pancreatitis.   Plan; check ANA and IgG 4 Schedule for MRCP Patient advised to continue low-fat diet and avoid alcohol She is aware to call us should she have any recurrence of abdominal pain. Further plans pending results of above Patient will be established with Miranda Pena

## 2014-05-16 LAB — ANA: Anti Nuclear Antibody(ANA): NEGATIVE

## 2014-05-17 LAB — IGG 4: IgG 4: 11 mg/dL (ref 1–291)

## 2014-05-20 NOTE — Progress Notes (Signed)
Agree with Ms. Esterwood's assessment and plan. Brysen Shankman E. Wilbur Labuda, MD, FACG   

## 2014-05-30 ENCOUNTER — Ambulatory Visit (HOSPITAL_COMMUNITY): Admission: RE | Admit: 2014-05-30 | Payer: 59 | Source: Ambulatory Visit

## 2014-06-11 ENCOUNTER — Ambulatory Visit (HOSPITAL_COMMUNITY): Payer: 59

## 2014-07-26 ENCOUNTER — Telehealth: Payer: Self-pay | Admitting: Family

## 2014-07-26 DIAGNOSIS — J019 Acute sinusitis, unspecified: Secondary | ICD-10-CM

## 2014-07-26 MED ORDER — AMOXICILLIN-POT CLAVULANATE 875-125 MG PO TABS: 1.0000 | ORAL_TABLET | Freq: Two times a day (BID) | ORAL | Status: DC

## 2014-07-26 NOTE — Progress Notes (Signed)

## 2015-10-01 MED FILL — PAZEO 0.7% EYE DROPS: 0.7 | 25 days supply | Qty: 3 | Fill #0

## 2015-10-26 MED FILL — AMETHYST 90-20 MCG TABLET: 90-20 | 28 days supply | Qty: 28 | Fill #1

## 2015-10-26 MED FILL — PAZEO 0.7% EYE DROPS: 0.7 | 25 days supply | Qty: 3 | Fill #1

## 2015-11-10 DIAGNOSIS — H04123 Dry eye syndrome of bilateral lacrimal glands: Secondary | ICD-10-CM | POA: Diagnosis not present

## 2015-11-10 DIAGNOSIS — H31092 Other chorioretinal scars, left eye: Secondary | ICD-10-CM | POA: Diagnosis not present

## 2015-11-10 DIAGNOSIS — H20012 Primary iridocyclitis, left eye: Secondary | ICD-10-CM | POA: Diagnosis not present

## 2015-11-10 MED FILL — PREDNISOLONE AC 1% EYE DROP: 1 | 8 days supply | Qty: 5 | Fill #0

## 2015-11-11 DIAGNOSIS — H31092 Other chorioretinal scars, left eye: Secondary | ICD-10-CM | POA: Diagnosis not present

## 2015-11-11 DIAGNOSIS — H04123 Dry eye syndrome of bilateral lacrimal glands: Secondary | ICD-10-CM | POA: Diagnosis not present

## 2015-11-11 DIAGNOSIS — H20012 Primary iridocyclitis, left eye: Secondary | ICD-10-CM | POA: Diagnosis not present

## 2015-11-11 MED FILL — PAZEO 0.7% EYE DROPS: 0.7 | 25 days supply | Qty: 3 | Fill #0

## 2015-11-16 DIAGNOSIS — H04123 Dry eye syndrome of bilateral lacrimal glands: Secondary | ICD-10-CM | POA: Diagnosis not present

## 2015-11-16 DIAGNOSIS — H20012 Primary iridocyclitis, left eye: Secondary | ICD-10-CM | POA: Diagnosis not present

## 2015-11-16 DIAGNOSIS — H31092 Other chorioretinal scars, left eye: Secondary | ICD-10-CM | POA: Diagnosis not present

## 2015-11-16 DIAGNOSIS — H43392 Other vitreous opacities, left eye: Secondary | ICD-10-CM | POA: Diagnosis not present

## 2015-11-19 MED FILL — AMETHYST 90-20 MCG TABLET: 90-20 | 28 days supply | Qty: 28 | Fill #2

## 2015-12-14 MED FILL — AMETHYST 90-20 MCG TABLET: 90-20 | 28 days supply | Qty: 28 | Fill #0

## 2015-12-14 MED FILL — PAZEO 0.7% EYE DROPS: 0.7 | 25 days supply | Qty: 3 | Fill #1

## 2015-12-22 DIAGNOSIS — H5203 Hypermetropia, bilateral: Secondary | ICD-10-CM | POA: Diagnosis not present

## 2016-01-05 MED FILL — DESOXIMETASONE 0.05% OINT: 0.05 | 7 days supply | Qty: 60 | Fill #2

## 2016-01-06 DIAGNOSIS — G43909 Migraine, unspecified, not intractable, without status migrainosus: Secondary | ICD-10-CM | POA: Diagnosis not present

## 2016-01-06 DIAGNOSIS — Z23 Encounter for immunization: Secondary | ICD-10-CM | POA: Diagnosis not present

## 2016-01-06 MED FILL — SUMATRIPTAN SUCC 50 MG TAB: 50 | 30 days supply | Qty: 9 | Fill #0

## 2016-01-08 MED FILL — PAZEO 0.7% EYE DROPS: 0.7 | 25 days supply | Qty: 3 | Fill #2

## 2016-01-13 MED FILL — AMETHYST 90-20 MCG TABLET: 90-20 | 84 days supply | Qty: 84 | Fill #1

## 2016-01-25 MED FILL — PAZEO 0.7% EYE DROPS: 0.7 | 25 days supply | Qty: 3 | Fill #2

## 2016-01-29 ENCOUNTER — Telehealth: Payer: 59 | Admitting: Family

## 2016-01-29 DIAGNOSIS — J019 Acute sinusitis, unspecified: Secondary | ICD-10-CM | POA: Diagnosis not present

## 2016-01-29 MED ORDER — AMOXICILLIN-POT CLAVULANATE 875-125 MG PO TABS: 1.0000 | ORAL_TABLET | Freq: Two times a day (BID) | ORAL | Status: DC

## 2016-01-29 MED FILL — AMOX TR-K CLV 875-125 MG TA: 875-125 | 7 days supply | Qty: 14 | Fill #0

## 2016-01-29 NOTE — Progress Notes (Signed)

## 2016-02-23 MED FILL — SUMATRIPTAN SUCC 50 MG TAB: 50 | 30 days supply | Qty: 9 | Fill #1

## 2016-03-01 DIAGNOSIS — L409 Psoriasis, unspecified: Secondary | ICD-10-CM | POA: Diagnosis not present

## 2016-03-01 DIAGNOSIS — D485 Neoplasm of uncertain behavior of skin: Secondary | ICD-10-CM | POA: Diagnosis not present

## 2016-03-01 DIAGNOSIS — D239 Other benign neoplasm of skin, unspecified: Secondary | ICD-10-CM | POA: Diagnosis not present

## 2016-03-07 MED FILL — PAZEO 0.7% EYE DROPS: 0.7 | 25 days supply | Qty: 3 | Fill #3

## 2016-03-09 MED FILL — BETAMETHASONE VALER 0.12% F: 0.12 | 45 days supply | Qty: 100 | Fill #0

## 2016-03-20 ENCOUNTER — Encounter (HOSPITAL_COMMUNITY): Payer: Self-pay | Admitting: Emergency Medicine

## 2016-03-20 ENCOUNTER — Emergency Department (HOSPITAL_COMMUNITY)
Admission: EM | Admit: 2016-03-20 | Discharge: 2016-03-21 | Disposition: A | Discharge status: Home / Self Care | Payer: 59 | Attending: Emergency Medicine | Admitting: Emergency Medicine

## 2016-03-20 DIAGNOSIS — Z792 Long term (current) use of antibiotics: Secondary | ICD-10-CM | POA: Diagnosis not present

## 2016-03-20 DIAGNOSIS — K858 Other acute pancreatitis without necrosis or infection: Secondary | ICD-10-CM | POA: Diagnosis not present

## 2016-03-20 DIAGNOSIS — R1010 Upper abdominal pain, unspecified: Secondary | ICD-10-CM | POA: Diagnosis present

## 2016-03-20 LAB — COMPREHENSIVE METABOLIC PANEL
ALBUMIN: 4.3 g/dL (ref 3.5–5.0)
ALK PHOS: 48 U/L (ref 38–126)
ALT: 24 U/L (ref 14–54)
ANION GAP: 7 (ref 5–15)
AST: 20 U/L (ref 15–41)
BUN: 13 mg/dL (ref 6–20)
CHLORIDE: 103 mmol/L (ref 101–111)
CO2: 29 mmol/L (ref 22–32)
CREATININE: 0.61 mg/dL (ref 0.44–1.00)
Calcium: 9.2 mg/dL (ref 8.9–10.3)
GFR calc non Af Amer: >60 mL/min (ref >60)
GLUCOSE: 98 mg/dL (ref 65–99)
Potassium: 3.8 mmol/L (ref 3.5–5.1)
SODIUM: 139 mmol/L (ref 135–145)
Total Bilirubin: 0.6 mg/dL (ref 0.3–1.2)
Total Protein: 7.6 g/dL (ref 6.5–8.1)

## 2016-03-20 LAB — CBC
HCT: 44.6 % (ref 36.0–46.0)
HEMOGLOBIN: 15.2 g/dL — AB (ref 12.0–15.0)
MCH: 32.3 pg (ref 26.0–34.0)
MCHC: 34.1 g/dL (ref 30.0–36.0)
MCV: 94.7 fL (ref 78.0–100.0)
Platelets: 340 10*3/uL (ref 150–400)
RBC: 4.71 MIL/uL (ref 3.87–5.11)
RDW: 12.4 % (ref 11.5–15.5)
WBC: 11.5 10*3/uL — ABNORMAL HIGH (ref 4.0–10.5)

## 2016-03-20 LAB — POC URINE PREG, ED: Preg Test, Ur: NEGATIVE

## 2016-03-20 MED ORDER — SODIUM CHLORIDE 0.9 % IV BOLUS (SEPSIS)
2000.0000 mL | Freq: Once | INTRAVENOUS | Status: AC
  Administered 2016-03-20: 2000 mL via INTRAVENOUS

## 2016-03-20 MED ORDER — MORPHINE SULFATE (PF) 4 MG/ML IV SOLN
6.0000 mg | Freq: Once | INTRAVENOUS | Status: AC
  Administered 2016-03-21: 6 mg via INTRAVENOUS
  Filled 2016-03-20: qty 2

## 2016-03-20 MED ORDER — FAMOTIDINE IN NACL 20-0.9 MG/50ML-% IV SOLN
20.0000 mg | Freq: Once | INTRAVENOUS | Status: AC
  Administered 2016-03-21: 20 mg via INTRAVENOUS
  Filled 2016-03-20: qty 50

## 2016-03-20 MED ORDER — ONDANSETRON HCL 4 MG/2ML IJ SOLN
4.0000 mg | Freq: Once | INTRAMUSCULAR | Status: AC
  Administered 2016-03-20: 4 mg via INTRAVENOUS
  Filled 2016-03-20: qty 2

## 2016-03-20 NOTE — ED Notes (Signed)
Pt. Reports low abdominal pain radiating to lower back with emesis and diarrhea onset this morning , denies fever /mild chills.

## 2016-03-20 NOTE — ED Provider Notes (Signed)
CSN: [id number]     Arrival date & time 03/20/16  2241 History  By signing my name below, I, Evelene Croon, attest that this documentation has been prepared under the direction and in the presence of Everlene Balls, MD . Electronically Signed: Evelene Croon, Scribe. 03/20/2016. 11:33 PM.  Chief Complaint  Patient presents with  . Abdominal Pain   The history is provided by the patient. No language interpreter was used.   HPI Comments:  Miranda Pena is a 40 y.o. female who presents to the Emergency Department complaining of gradually worsening, nausea, vomiting, and diarrhea x 1 day. She reports associated acid sensation in her chest, and "mild but always present discomfort" in her upper abdomen that radiates around to her back . She reports h/o similar pain when diagnosed with acute pancreatitis 2 years ago. She denies smoking and drinking history. She also denies h/o gallstones; states doctors were unable to tell her what may have caused her pancreatitis. Pt reports eating greasy food yesterday; states she doesn't usually eat like that. She also notes chills. She denies fever, sick contacts at home, and h/o abdominal surgery. No alleviating factors noted.    Past Medical History  Diagnosis Date  . Migraine   . Pancreatitis    History reviewed. No pertinent past surgical history. Family History  Problem Relation Age of Onset  . Thyroid disease Father   . Thyroid disease Mother   . Migraines Sister   . Diverticulitis Sister    Social History  Substance Use Topics  . Smoking status: Never Smoker   . Smokeless tobacco: Never Used  . Alcohol Use: No   OB History    No data available     Review of Systems  10 systems reviewed and all are negative for acute change except as noted in the HPI.   Allergies  Review of patient's allergies indicates no known allergies.  Home Medications   Prior to Admission medications   Medication Sig Start Date End Date Taking? Authorizing Provider   amoxicillin-clavulanate (AUGMENTIN) 875-125 MG tablet Take 1 tablet by mouth 2 (two) times daily. Patient not taking: Reported on 03/20/2016 01/29/16   Sharion Balloon, FNP   BP 112/80 mmHg  Pulse 61  Temp(Src) 98.4 F (36.9 C) (Oral)  Resp 16  Ht 5\' 2"  (1.575 m)  Wt 147 lb (66.679 kg)  BMI 26.88 kg/m2  SpO2 100%  LMP 03/13/2016 Physical Exam  Constitutional: She is oriented to person, place, and time. She appears well-developed and well-nourished. No distress.  HENT:  Head: Normocephalic and atraumatic.  Nose: Nose normal.  Mouth/Throat: Oropharynx is clear and moist. No oropharyngeal exudate.  Eyes: Conjunctivae and EOM are normal. Pupils are equal, round, and reactive to light. No scleral icterus.  Neck: Normal range of motion. Neck supple. No JVD present. No tracheal deviation present. No thyromegaly present.  Cardiovascular: Normal rate, regular rhythm and normal heart sounds.  Exam reveals no gallop and no friction rub.   No murmur heard. Pulmonary/Chest: Effort normal and breath sounds normal. No respiratory distress. She has no wheezes. She exhibits no tenderness.  Abdominal: Soft. Bowel sounds are normal. She exhibits no distension and no mass. There is tenderness in the periumbilical area. There is no rebound and no guarding.  Musculoskeletal: Normal range of motion. She exhibits no edema or tenderness.  Lymphadenopathy:    She has no cervical adenopathy.  Neurological: She is alert and oriented to person, place, and time. No cranial nerve deficit.  She exhibits normal muscle tone.  Skin: Skin is warm and dry. No rash noted. No erythema. No pallor.  Nursing note and vitals reviewed.   ED Course  Procedures  DIAGNOSTIC STUDIES:  Oxygen Saturation is 100% on RA, normal by my interpretation.    COORDINATION OF CARE:  11:32 PM Pt updated with partial results. Will order pain meds.  Discussed treatment plan with pt at bedside and pt agreed to plan.  Labs Review Labs  Reviewed  LIPASE, BLOOD - Abnormal; Notable for the following:    Lipase 678 (*)    All other components within normal limits  CBC - Abnormal; Notable for the following:    WBC 11.5 (*)    Hemoglobin 15.2 (*)    All other components within normal limits  URINALYSIS, ROUTINE W REFLEX MICROSCOPIC (NOT AT [facility]) - Abnormal; Notable for the following:    Leukocytes, UA TRACE (*)    All other components within normal limits  URINE MICROSCOPIC-ADD ON - Abnormal; Notable for the following:    Squamous Epithelial / LPF 0-5 (*)    Bacteria, UA RARE (*)    All other components within normal limits  COMPREHENSIVE METABOLIC PANEL  MAGNESIUM  POC URINE PREG, ED    Imaging Review No results found. I have personally reviewed and evaluated these images and lab results as part of my medical decision-making.   EKG Interpretation None      MDM   Final diagnoses:  None    Patient presents to the ED for abdominal pain that she states is consistent with her pancreatitis.  Will give IVF, morphine and zofran for pain control.  Labs sent.  No known cause of her pancreatitis during her last admission.  Will continue to reevaluate.   2:02 AM Uponr epeat evaluation, patient feels much better.  Will Dc home with zofran.  This was a pancreatitis flare again.  Lipase was almost 700.  GI follow up provided.  She tolerated water 2 cups in the ED.  She appears well and in NAD. VS remain within her normal limits and she is safe for DC.   I personally performed the services described in this documentation, which was scribed in my presence. The recorded information has been reviewed and is accurate.     Everlene Balls, MD 03/21/16 [phone]

## 2016-03-21 DIAGNOSIS — K858 Other acute pancreatitis without necrosis or infection: Secondary | ICD-10-CM | POA: Diagnosis not present

## 2016-03-21 DIAGNOSIS — Z792 Long term (current) use of antibiotics: Secondary | ICD-10-CM | POA: Diagnosis not present

## 2016-03-21 LAB — URINALYSIS, ROUTINE W REFLEX MICROSCOPIC
BILIRUBIN URINE: NEGATIVE
GLUCOSE, UA: NEGATIVE mg/dL
HGB URINE DIPSTICK: NEGATIVE
Ketones, ur: NEGATIVE mg/dL
Nitrite: NEGATIVE
PROTEIN: NEGATIVE mg/dL
Specific Gravity, Urine: 1.028 (ref 1.005–1.030)
pH: 7 (ref 5.0–8.0)

## 2016-03-21 LAB — LIPASE, BLOOD: LIPASE: 678 U/L — AB (ref 11–51)

## 2016-03-21 LAB — URINE MICROSCOPIC-ADD ON
RBC / HPF: NONE SEEN RBC/hpf (ref 0–5)
WBC, UA: NONE SEEN WBC/hpf (ref 0–5)

## 2016-03-21 LAB — MAGNESIUM: Magnesium: 2.1 mg/dL (ref 1.7–2.4)

## 2016-03-21 MED ORDER — ONDANSETRON 4 MG PO TBDP: 4.0000 mg | ORAL_TABLET | Freq: Three times a day (TID) | ORAL | Status: DC | PRN

## 2016-03-21 MED FILL — ONDANSETRON ODT 4 MG TABLET: 4 | 4 days supply | Qty: 12 | Fill #0

## 2016-03-21 NOTE — Discharge Instructions (Signed)
Acute Pancreatitis Miranda Pena, eat a liquid diet for 2 days then slowly work yourself back up to normal.  Avoid fatty foods to prevent this from happening again. See GI within 3  Days for close follow up. If symptoms worsen, come back to the ED immediately. Thank you. Acute pancreatitis is a disease in which the pancreas becomes suddenly irritated (inflamed). The pancreas is a large gland behind your stomach. The pancreas makes enzymes that help digest food. The pancreas also makes 2 hormones that help control your blood sugar. Acute pancreatitis happens when the enzymes attack and damage the pancreas. Most attacks last a couple of days and can cause serious problems. HOME CARE  Follow your doctor's diet instructions. You may need to avoid alcohol and limit fat in your diet.  Eat small meals often.  Drink enough fluids to keep your pee (urine) clear or pale yellow.  Only take medicines as told by your doctor.  Avoid drinking alcohol if it caused your disease.  Do not smoke.  Get plenty of rest.  Check your blood sugar at home as told by your doctor.  Keep all doctor visits as told. GET HELP IF:  You do not get better as quickly as expected.  You have new or worsening symptoms.  You have lasting pain, weakness, or feel sick to your stomach (nauseous).  You get better and then have another pain attack. GET HELP RIGHT AWAY IF:   You are unable to eat or keep fluids down.  Your pain becomes severe.  You have a fever or lasting symptoms for more than 2 to 3 days.  You have a fever and your symptoms suddenly get worse.  Your skin or the white part of your eyes turn yellow (jaundice).  You throw up (vomit).  You feel dizzy, or you pass out (faint).  Your blood sugar is high (over 300 mg/dL). MAKE SURE YOU:   Understand these instructions.  Will watch your condition.  Will get help right away if you are not doing well or get worse.   This information is not intended to  replace advice given to you by your health care provider. Make sure you discuss any questions you have with your health care provider.   Document Released: 02/22/2008 Document Revised: 09/26/2014 Document Reviewed: 12/15/2011 Elsevier Interactive Patient Education [employer].

## 2016-03-21 NOTE — ED Notes (Signed)
Pt given water 

## 2016-03-29 MED FILL — PAZEO 0.7% EYE DROPS: 0.7 | 25 days supply | Qty: 3 | Fill #3

## 2016-03-31 DIAGNOSIS — K859 Acute pancreatitis without necrosis or infection, unspecified: Secondary | ICD-10-CM | POA: Diagnosis not present

## 2016-04-28 MED FILL — PAZEO 0.7% EYE DROPS: 0.7 | 25 days supply | Qty: 3 | Fill #0

## 2016-06-02 DIAGNOSIS — F411 Generalized anxiety disorder: Secondary | ICD-10-CM | POA: Diagnosis not present

## 2016-06-02 DIAGNOSIS — G43909 Migraine, unspecified, not intractable, without status migrainosus: Secondary | ICD-10-CM | POA: Diagnosis not present

## 2016-06-02 DIAGNOSIS — Z8349 Family history of other endocrine, nutritional and metabolic diseases: Secondary | ICD-10-CM | POA: Diagnosis not present

## 2016-06-02 DIAGNOSIS — Z0000 Encounter for general adult medical examination without abnormal findings: Secondary | ICD-10-CM | POA: Diagnosis not present

## 2016-06-02 DIAGNOSIS — K859 Acute pancreatitis without necrosis or infection, unspecified: Secondary | ICD-10-CM | POA: Diagnosis not present

## 2016-06-02 DIAGNOSIS — Z23 Encounter for immunization: Secondary | ICD-10-CM | POA: Diagnosis not present

## 2016-06-02 MED FILL — ALPRAZolam 0.25 MG TABS: 0.25 | 30 days supply | Qty: 20 | Fill #0

## 2016-06-13 MED FILL — OLOPATADINE HCL 0.2% EYE DR: 0.2 | 30 days supply | Qty: 3 | Fill #0

## 2016-06-14 ENCOUNTER — Other Ambulatory Visit: Payer: Self-pay | Admitting: Gastroenterology

## 2016-06-17 ENCOUNTER — Other Ambulatory Visit: Payer: Self-pay | Admitting: Gastroenterology

## 2016-06-20 ENCOUNTER — Encounter (HOSPITAL_COMMUNITY): Payer: Self-pay | Admitting: *Deleted

## 2016-06-22 ENCOUNTER — Ambulatory Visit (HOSPITAL_COMMUNITY): Admission: RE | Admit: 2016-06-22 | Payer: 59 | Source: Ambulatory Visit | Admitting: Gastroenterology

## 2016-06-22 SURGERY — ESOPHAGOGASTRODUODENOSCOPY (EGD) WITH PROPOFOL
Anesthesia: Monitor Anesthesia Care

## 2016-07-06 DIAGNOSIS — Z1231 Encounter for screening mammogram for malignant neoplasm of breast: Secondary | ICD-10-CM | POA: Diagnosis not present

## 2016-07-06 DIAGNOSIS — Z01419 Encounter for gynecological examination (general) (routine) without abnormal findings: Secondary | ICD-10-CM | POA: Diagnosis not present

## 2016-07-11 MED FILL — RESTASIS 0.05% EYE EMULSION: 0.05 | 90 days supply | Qty: 180 | Fill #1

## 2016-07-11 MED FILL — OLOPATADINE HCL 0.2% EYE DR: 0.2 | 30 days supply | Qty: 3 | Fill #1

## 2016-08-01 MED FILL — DESOXIMETASONE 0.05% OINT: 0.05 | 20 days supply | Qty: 60 | Fill #0

## 2016-08-05 MED FILL — OLOPATADINE HCL 0.2% EYE DR: 0.2 | 30 days supply | Qty: 3 | Fill #2

## 2016-08-09 MED FILL — ALPRAZolam 0.25 MG TABS: 0.25 | 20 days supply | Qty: 20 | Fill #0

## 2016-09-03 DIAGNOSIS — H578 Other specified disorders of eye and adnexa: Secondary | ICD-10-CM | POA: Diagnosis not present

## 2016-09-14 MED FILL — DESOXIMETASONE 0.05% OINT: 0.05 | 20 days supply | Qty: 60 | Fill #1

## 2016-09-14 MED FILL — OLOPATADINE HCL 0.2% EYE DR: 0.2 | 30 days supply | Qty: 3 | Fill #3

## 2016-11-16 ENCOUNTER — Ambulatory Visit (INDEPENDENT_AMBULATORY_CARE_PROVIDER_SITE_OTHER): Payer: 59 | Admitting: Family Medicine

## 2016-11-16 ENCOUNTER — Encounter: Payer: Self-pay | Admitting: Family Medicine

## 2016-11-16 DIAGNOSIS — M79672 Pain in left foot: Secondary | ICD-10-CM | POA: Diagnosis not present

## 2016-11-18 DIAGNOSIS — M79672 Pain in left foot: Secondary | ICD-10-CM | POA: Insufficient documentation

## 2016-11-18 NOTE — Assessment & Plan Note (Signed)
Likely she is suffering from plantar fasciitis. Likely as result of her changes within her foot structure. - Try midfoot strap - Green sport insoles - HEP - Can consider custom orthotics in the future

## 2016-11-18 NOTE — Progress Notes (Signed)
  Miranda Pena - 41 y.o. female MRN [id number]  Date of birth: 1976/04/21  SUBJECTIVE:  Including CC & ROS.   Miranda Pena is a 41 year old female that is presenting with left heel pain. Feels like this pain is acute on chronic in nature. She feels like she's had this pain for 2 years but has gotten worse in the last month. She feels like they're more flareups now. She has to hobble when she gets in and out of bed. She is taking ibuprofen. She denies any injury. She has not tried orthotics. She has not had any bruising. She walks roughly between 10,000-15,000 steps per day.  The pain is worse as she walks more.  ROS: No unexpected weight loss, fever, chills, swelling, instability, muscle pain, numbness/tingling, redness, otherwise see HPI    HISTORY: Past Medical, Surgical, Social, and Family History Reviewed & Updated per EMR.   Pertinent Historical Findings include: PMSHx -   none PSHx -  occasional alcohol use, no tobacco use FHx -  thyroid issues  DATA REVIEWED: None to review   PHYSICAL EXAM:  VS: BP:105/84  HR: bpm  TEMP: ( )  RESP:   HT:5\' 2"  (157.5 cm)   WT:147 lb (66.7 kg)  BMI:26.9 PHYSICAL EXAM: Gen: NAD, alert, cooperative with exam, well-appearing HEENT: clear conjunctiva, EOMI CV:  no edema, capillary refill brisk,  Resp: non-labored, normal speech Skin: no rashes, normal turgor  Neuro: no gross deficits.  Psych:  alert and oriented Foot:  Tenderness to palpation over the medial plantar calcaneus No tennis to palpation of the lateral medial malleoli. No tenderness to palpation of the insertion of the Achilles. No overlying swelling or ecchymosis. Range of motion in all directions. No strength. No pain with squeezing of the calcaneus. Able rise up on her tiptoes. Has normal gait. Neurovascular intact. No leg length discrepancy She has some flattening of the transverse arch.   ASSESSMENT & PLAN:   Left foot pain Likely she is suffering from plantar  fasciitis. Likely as result of her changes within her foot structure. - Try midfoot strap - Green sport insoles - HEP - Can consider custom orthotics in the future

## 2016-12-02 MED FILL — ALPRAZolam 0.25 MG TABS: 0.25 | 20 days supply | Qty: 20 | Fill #0

## 2016-12-05 ENCOUNTER — Encounter: Payer: Self-pay | Admitting: Sports Medicine

## 2016-12-05 ENCOUNTER — Ambulatory Visit (INDEPENDENT_AMBULATORY_CARE_PROVIDER_SITE_OTHER): Payer: 59 | Admitting: Sports Medicine

## 2016-12-05 VITALS — BP 119/76 | HR 70 | Ht 62.0 in | Wt 149.0 lb

## 2016-12-05 DIAGNOSIS — M79672 Pain in left foot: Secondary | ICD-10-CM | POA: Diagnosis not present

## 2016-12-05 NOTE — Progress Notes (Signed)
Patient ID: Miranda Pena, female   DOB: 1976/03/06, 41 y.o.   MRN: [id number]  Patient comes in today for custom orthotics. Please see the office note by Dr Clearance Coots for details regarding history and physical exam findings. In short, this patient has a history of plantar fasciitis. It was thought that custom orthotics may help with her symptoms.  Examination of her foot prior to placement of her custom orthotics in her shoes shows pes planus with standing and pronation with walking. This was well corrected with her orthotics. Total time of 30 minutes was spent with the patient with greater than 50% of the time spent in face-to-face consultation discussing orthotic construction, instruction, and fitting. Patient will continue with other treatment for her plantar fasciitis including arch strap and home exercise program. Follow-up as needed.  Patient was fitted for a : standard, cushioned, semi-rigid orthotic. The orthotic was heated and afterward the patient stood on the orthotic blank positioned on the orthotic stand. The patient was positioned in subtalar neutral position and 10 degrees of ankle dorsiflexion in a weight bearing stance. After completion of molding, a stable base was applied to the orthotic blank. The blank was ground to a stable position for weight bearing. Size: 6 Base: blue EVA Posting: none Additional orthotic padding: none

## 2016-12-05 NOTE — Progress Notes (Signed)
   Subjective:    Patient ID: Miranda Pena, female    DOB: [birth year], 41 y.o.   MRN: [id number]  HPI    Review of Systems     Objective:   Physical Exam        Assessment & Plan:

## 2017-01-02 DIAGNOSIS — H5203 Hypermetropia, bilateral: Secondary | ICD-10-CM | POA: Diagnosis not present

## 2017-01-20 DIAGNOSIS — J029 Acute pharyngitis, unspecified: Secondary | ICD-10-CM | POA: Diagnosis not present

## 2017-02-08 ENCOUNTER — Encounter: Payer: Self-pay | Admitting: Sports Medicine

## 2017-02-08 ENCOUNTER — Ambulatory Visit (INDEPENDENT_AMBULATORY_CARE_PROVIDER_SITE_OTHER): Payer: 59 | Admitting: Sports Medicine

## 2017-02-08 VITALS — BP 107/52 | Ht 62.0 in | Wt 149.0 lb

## 2017-02-08 DIAGNOSIS — M722 Plantar fascial fibromatosis: Secondary | ICD-10-CM

## 2017-02-08 NOTE — Progress Notes (Signed)
   Subjective:    Patient ID: Miranda Pena, female    DOB: 02-Feb-1976, 41 y.o.   MRN: [id number]  HPI chief complaint: Left heel pain  Very pleasant 41 year old female comes in today with returning left plantar fascial pain. She has a history of intermittent plantar fasciitis over the past couple of years. She has custom orthotics which are helpful. Her pain is present primarily with prolonged walking. She is concerned because she has a couple of vacations planned for this summer which requires a lot of walking. She has been diligent with her home exercises and with icing. She is asking about the possibility of a cortisone injection. She denies any trauma. No numbness or tingling.    Review of Systems    as above Objective:   Physical Exam  Well-developed, well-nourished. No acute distress. Awake alert and oriented 3. Vital signs reviewed  Left heel: Patient has tenderness to palpation at the calcaneal origin of the plantar fascia. Negative calcaneal squeeze. No soft tissue swelling. Neurovascularly intact distally.      Assessment & Plan:   Returning left heel pain secondary to plantar fasciitis  Patient has not really tried any oral medication yet for her problem. I'm going to tentatively schedule an ultrasound-guided plantar fascial injection to be done next week. In the meantime, I've suggested that she try 600 mg of Advil one hour prior to exercise. I've also encouraged her to ice post exercise and continue with her home exercise program. She already has custom orthotics. If she continues to have pain despite using Advil over the next several days then she will follow-up next week for her injection.

## 2017-02-14 ENCOUNTER — Ambulatory Visit: Payer: 59 | Admitting: Sports Medicine

## 2017-05-23 DIAGNOSIS — L409 Psoriasis, unspecified: Secondary | ICD-10-CM | POA: Diagnosis not present

## 2017-05-23 DIAGNOSIS — D229 Melanocytic nevi, unspecified: Secondary | ICD-10-CM | POA: Diagnosis not present

## 2017-05-30 MED FILL — ALPRAZolam 0.25 MG TABS: 0.25 | 20 days supply | Qty: 20 | Fill #0

## 2017-07-12 DIAGNOSIS — Z23 Encounter for immunization: Secondary | ICD-10-CM | POA: Diagnosis not present

## 2017-07-25 DIAGNOSIS — Z8349 Family history of other endocrine, nutritional and metabolic diseases: Secondary | ICD-10-CM | POA: Diagnosis not present

## 2017-07-25 DIAGNOSIS — Z23 Encounter for immunization: Secondary | ICD-10-CM | POA: Diagnosis not present

## 2017-07-25 DIAGNOSIS — K859 Acute pancreatitis without necrosis or infection, unspecified: Secondary | ICD-10-CM | POA: Diagnosis not present

## 2017-07-25 DIAGNOSIS — Z0000 Encounter for general adult medical examination without abnormal findings: Secondary | ICD-10-CM | POA: Diagnosis not present

## 2017-07-25 DIAGNOSIS — F411 Generalized anxiety disorder: Secondary | ICD-10-CM | POA: Diagnosis not present

## 2017-07-25 DIAGNOSIS — G43909 Migraine, unspecified, not intractable, without status migrainosus: Secondary | ICD-10-CM | POA: Diagnosis not present

## 2017-07-25 DIAGNOSIS — Z136 Encounter for screening for cardiovascular disorders: Secondary | ICD-10-CM | POA: Diagnosis not present

## 2017-07-27 DIAGNOSIS — Z8262 Family history of osteoporosis: Secondary | ICD-10-CM | POA: Diagnosis not present

## 2017-07-27 DIAGNOSIS — Z01419 Encounter for gynecological examination (general) (routine) without abnormal findings: Secondary | ICD-10-CM | POA: Diagnosis not present

## 2017-07-27 DIAGNOSIS — Z1231 Encounter for screening mammogram for malignant neoplasm of breast: Secondary | ICD-10-CM | POA: Diagnosis not present

## 2017-08-02 MED FILL — DESOXIMETASONE 0.05% OINT: 0.05 | 20 days supply | Qty: 60 | Fill #0

## 2017-09-01 ENCOUNTER — Telehealth: Payer: 59 | Admitting: Nurse Practitioner

## 2017-09-01 DIAGNOSIS — J0100 Acute maxillary sinusitis, unspecified: Secondary | ICD-10-CM

## 2017-09-01 MED ORDER — AMOXICILLIN-POT CLAVULANATE 875-125 MG PO TABS: 1.0000 | ORAL_TABLET | Freq: Two times a day (BID) | ORAL | 0 refills | Status: DC

## 2017-09-01 NOTE — Progress Notes (Signed)

## 2017-09-25 MED FILL — SUMATRIPTAN SUCC 50 MG TABL: 50 | 30 days supply | Qty: 12 | Fill #0

## 2017-09-29 DIAGNOSIS — G43909 Migraine, unspecified, not intractable, without status migrainosus: Secondary | ICD-10-CM | POA: Diagnosis not present

## 2017-10-02 MED FILL — ELETRIPTAN HBR 40 MG TABLET: 40 | 30 days supply | Qty: 6 | Fill #0

## 2017-10-06 DIAGNOSIS — R51 Headache: Secondary | ICD-10-CM | POA: Diagnosis not present

## 2017-10-06 MED FILL — ESCITALOPRAM 10 MG TABLET: 10 | 30 days supply | Qty: 30 | Fill #0

## 2017-11-28 MED FILL — DESOXIMETASONE 0.05% OINT: 0.05 | 20 days supply | Qty: 60 | Fill #1

## 2017-12-07 ENCOUNTER — Telehealth: Payer: 59 | Admitting: Family

## 2017-12-07 DIAGNOSIS — J329 Chronic sinusitis, unspecified: Secondary | ICD-10-CM | POA: Diagnosis not present

## 2017-12-07 DIAGNOSIS — B9689 Other specified bacterial agents as the cause of diseases classified elsewhere: Secondary | ICD-10-CM | POA: Diagnosis not present

## 2017-12-07 MED ORDER — AMOXICILLIN-POT CLAVULANATE 875-125 MG PO TABS: 1.0000 | ORAL_TABLET | Freq: Two times a day (BID) | ORAL | 0 refills | Status: AC

## 2017-12-07 MED FILL — AMOXICILLIN-POT CLAVULANATE: 875-125 | 7 days supply | Qty: 14 | Fill #0

## 2017-12-07 NOTE — Progress Notes (Signed)

## 2018-01-29 DIAGNOSIS — H5203 Hypermetropia, bilateral: Secondary | ICD-10-CM | POA: Diagnosis not present

## 2018-02-27 ENCOUNTER — Telehealth: Payer: 59 | Admitting: Family

## 2018-02-27 DIAGNOSIS — B9689 Other specified bacterial agents as the cause of diseases classified elsewhere: Secondary | ICD-10-CM

## 2018-02-27 DIAGNOSIS — J019 Acute sinusitis, unspecified: Secondary | ICD-10-CM

## 2018-02-27 MED ORDER — AMOXICILLIN-POT CLAVULANATE 875-125 MG PO TABS: 1.0000 | ORAL_TABLET | Freq: Two times a day (BID) | ORAL | 0 refills | Status: DC

## 2018-02-27 MED FILL — AMOX-CLAV 875-125 MG TABLET: 875-125 | 7 days supply | Qty: 14 | Fill #0

## 2018-02-27 NOTE — Progress Notes (Signed)

## 2018-03-07 MED FILL — PENICILLIN VK 500 MG TABLET: 500 | 14 days supply | Qty: 42 | Fill #0

## 2018-03-15 MED FILL — ALPRAZolam 0.25 MG TABS: 0.25 | 30 days supply | Qty: 20 | Fill #0

## 2018-03-19 MED FILL — BETAMETHASONE VALER 0.12% F: 0.12 | 30 days supply | Qty: 100 | Fill #0

## 2018-06-07 DIAGNOSIS — L409 Psoriasis, unspecified: Secondary | ICD-10-CM | POA: Diagnosis not present

## 2018-06-07 DIAGNOSIS — D229 Melanocytic nevi, unspecified: Secondary | ICD-10-CM | POA: Diagnosis not present

## 2018-06-13 DIAGNOSIS — Z23 Encounter for immunization: Secondary | ICD-10-CM | POA: Diagnosis not present

## 2018-06-20 MED FILL — DESOXIMETASONE 0.05% OINT: 0.05 | 90 days supply | Qty: 60 | Fill #0

## 2018-09-05 DIAGNOSIS — L409 Psoriasis, unspecified: Secondary | ICD-10-CM | POA: Diagnosis not present

## 2018-09-05 DIAGNOSIS — Z136 Encounter for screening for cardiovascular disorders: Secondary | ICD-10-CM | POA: Diagnosis not present

## 2018-09-05 DIAGNOSIS — F411 Generalized anxiety disorder: Secondary | ICD-10-CM | POA: Diagnosis not present

## 2018-09-05 DIAGNOSIS — Z131 Encounter for screening for diabetes mellitus: Secondary | ICD-10-CM | POA: Diagnosis not present

## 2018-09-05 DIAGNOSIS — Z0000 Encounter for general adult medical examination without abnormal findings: Secondary | ICD-10-CM | POA: Diagnosis not present

## 2018-09-05 DIAGNOSIS — G43909 Migraine, unspecified, not intractable, without status migrainosus: Secondary | ICD-10-CM | POA: Diagnosis not present

## 2018-09-05 MED FILL — ALPRAZolam 0.25 MG TABS: 0.25 | 20 days supply | Qty: 20 | Fill #0

## 2018-09-05 MED FILL — BETAMETHASONE VALER 0.12% F: 0.12 | 30 days supply | Qty: 50 | Fill #0

## 2018-09-07 MED FILL — ELETRIPTAN HYDROBROMIDE 40: 40 | 30 days supply | Qty: 6 | Fill #0

## 2018-11-29 ENCOUNTER — Telehealth: Payer: 59 | Admitting: Physician Assistant

## 2018-11-29 DIAGNOSIS — J019 Acute sinusitis, unspecified: Secondary | ICD-10-CM | POA: Diagnosis not present

## 2018-11-29 DIAGNOSIS — B9689 Other specified bacterial agents as the cause of diseases classified elsewhere: Secondary | ICD-10-CM

## 2018-11-29 MED ORDER — AMOXICILLIN-POT CLAVULANATE 875-125 MG PO TABS: 1.0000 | ORAL_TABLET | Freq: Two times a day (BID) | ORAL | 0 refills | Status: DC

## 2018-11-29 NOTE — Progress Notes (Signed)

## 2018-11-29 NOTE — Progress Notes (Signed)
I have spent 5 minutes in review of e-visit questionnaire, review and updating patient chart, medical decision making and response to patient.   Charnel Giles Cody Darah Simkin, PA-C    

## 2018-11-30 MED FILL — AMOX-CLAV 875-125 MG TABLET: 875-125 | 7 days supply | Qty: 14 | Fill #0

## 2018-12-16 ENCOUNTER — Telehealth: Payer: 59 | Admitting: Nurse Practitioner

## 2018-12-16 DIAGNOSIS — J0101 Acute recurrent maxillary sinusitis: Secondary | ICD-10-CM

## 2018-12-16 MED ORDER — DOXYCYCLINE HYCLATE 100 MG PO TABS: 100.0000 mg | ORAL_TABLET | Freq: Two times a day (BID) | ORAL | 0 refills | Status: DC

## 2018-12-16 NOTE — Progress Notes (Signed)
We are sorry that you are not feeling well.  Here is how we plan to help!  Based on what you have shared with me it looks like you have sinusitis.  Sinusitis is inflammation and infection in the sinus cavities of the head.  Based on your presentation I believe you most likely have Acute Bacterial Sinusitis.  This is an infection caused by bacteria and is treated with antibiotics. I have prescribed Doxycycline 100mg by mouth twice a day for 10 days. You may use an oral decongestant such as Mucinex D or if you have glaucoma or high blood pressure use plain Mucinex. Saline nasal spray help and can safely be used as often as needed for congestion.  If you develop worsening sinus pain, fever or notice severe headache and vision changes, or if symptoms are not better after completion of antibiotic, please schedule an appointment with a health care provider.    Sinus infections are not as easily transmitted as other respiratory infection, however we still recommend that you avoid close contact with loved ones, especially the very young and elderly.  Remember to wash your hands thoroughly throughout the day as this is the number one way to prevent the spread of infection!  Home Care:  Only take medications as instructed by your medical team.  Complete the entire course of an antibiotic.  Do not take these medications with alcohol.  A steam or ultrasonic humidifier can help congestion.  You can place a towel over your head and breathe in the steam from hot water coming from a faucet.  Avoid close contacts especially the very young and the elderly.  Cover your mouth when you cough or sneeze.  Always remember to wash your hands.  Get Help Right Away If:  You develop worsening fever or sinus pain.  You develop a severe head ache or visual changes.  Your symptoms persist after you have completed your treatment plan.  Make sure you  Understand these instructions.  Will watch your  condition.  Will get help right away if you are not doing well or get worse.  Your e-visit answers were reviewed by a board certified advanced clinical practitioner to complete your personal care plan.  Depending on the condition, your plan could have included both over the counter or prescription medications.  If there is a problem please reply  once you have received a response from your provider.  Your safety is important to us.  If you have drug allergies check your prescription carefully.    You can use MyChart to ask questions about today's visit, request a non-urgent call back, or ask for a work or school excuse for 24 hours related to this e-Visit. If it has been greater than 24 hours you will need to follow up with your provider, or enter a new e-Visit to address those concerns.  You will get an e-mail in the next two days asking about your experience.  I hope that your e-visit has been valuable and will speed your recovery. Thank you for using e-visits.  5 minutes spent reviewing and documenting in chart.   

## 2018-12-17 MED FILL — DOXYCYCLINE HYCLATE 100 MG: 100 | 10 days supply | Qty: 20 | Fill #0

## 2018-12-31 ENCOUNTER — Other Ambulatory Visit: Payer: Self-pay | Admitting: Nurse Practitioner

## 2019-01-01 ENCOUNTER — Other Ambulatory Visit: Payer: Self-pay | Admitting: Nurse Practitioner

## 2019-01-14 MED FILL — DESOXIMETASONE 0.05 % OINT: 0.05 | 90 days supply | Qty: 60 | Fill #0

## 2019-01-14 MED FILL — BETAMETHASONE VALER 0.12% F: 0.12 | 30 days supply | Qty: 50 | Fill #0

## 2019-03-06 DIAGNOSIS — M5412 Radiculopathy, cervical region: Secondary | ICD-10-CM | POA: Diagnosis not present

## 2019-03-06 DIAGNOSIS — F411 Generalized anxiety disorder: Secondary | ICD-10-CM | POA: Diagnosis not present

## 2019-03-06 MED FILL — predniSONE 10 MG TABS: 10 | 6 days supply | Qty: 21 | Fill #0

## 2019-03-06 MED FILL — ALPRAZolam 0.25 MG TABS: 0.25 | 20 days supply | Qty: 20 | Fill #0

## 2019-05-04 DIAGNOSIS — Z23 Encounter for immunization: Secondary | ICD-10-CM | POA: Diagnosis not present

## 2019-05-30 DIAGNOSIS — L700 Acne vulgaris: Secondary | ICD-10-CM | POA: Diagnosis not present

## 2019-06-03 MED FILL — DOXYCYCLINE HYCLATE 100 MG: 100 | 33 days supply | Qty: 40 | Fill #0

## 2019-06-26 ENCOUNTER — Encounter: Payer: Self-pay | Admitting: *Deleted

## 2019-07-09 DIAGNOSIS — Z0100 Encounter for examination of eyes and vision without abnormal findings: Secondary | ICD-10-CM | POA: Diagnosis not present

## 2019-07-26 DIAGNOSIS — D229 Melanocytic nevi, unspecified: Secondary | ICD-10-CM | POA: Diagnosis not present

## 2019-07-26 DIAGNOSIS — L409 Psoriasis, unspecified: Secondary | ICD-10-CM | POA: Diagnosis not present

## 2019-09-09 MED FILL — ALPRAZolam 0.25 MG TABS: 0.25 | 20 days supply | Qty: 20 | Fill #0

## 2019-09-17 MED FILL — DESOXIMETASONE 0.05 % OINT: 0.05 | 14 days supply | Qty: 60 | Fill #0

## 2019-09-17 MED FILL — ALPRAZolam 0.25 MG TABS: 0.25 | 20 days supply | Qty: 20 | Fill #0

## 2019-10-14 DIAGNOSIS — Z0000 Encounter for general adult medical examination without abnormal findings: Secondary | ICD-10-CM | POA: Diagnosis not present

## 2019-10-14 DIAGNOSIS — L409 Psoriasis, unspecified: Secondary | ICD-10-CM | POA: Diagnosis not present

## 2019-10-14 DIAGNOSIS — F411 Generalized anxiety disorder: Secondary | ICD-10-CM | POA: Diagnosis not present

## 2019-10-14 DIAGNOSIS — G43909 Migraine, unspecified, not intractable, without status migrainosus: Secondary | ICD-10-CM | POA: Diagnosis not present

## 2019-10-23 DIAGNOSIS — Z13220 Encounter for screening for lipoid disorders: Secondary | ICD-10-CM | POA: Diagnosis not present

## 2019-10-23 DIAGNOSIS — Z0000 Encounter for general adult medical examination without abnormal findings: Secondary | ICD-10-CM | POA: Diagnosis not present

## 2019-11-11 ENCOUNTER — Other Ambulatory Visit: Payer: Self-pay | Admitting: Family Medicine

## 2019-11-11 DIAGNOSIS — Z1231 Encounter for screening mammogram for malignant neoplasm of breast: Secondary | ICD-10-CM

## 2019-12-24 ENCOUNTER — Ambulatory Visit: Payer: 59

## 2020-01-24 MED FILL — ALPRAZolam 0.25 MG TABS: 0.25 | 20 days supply | Qty: 20 | Fill #0

## 2020-01-27 ENCOUNTER — Other Ambulatory Visit: Payer: Self-pay

## 2020-01-27 ENCOUNTER — Ambulatory Visit
Admission: RE | Admit: 2020-01-27 | Discharge: 2020-01-27 | Disposition: A | Discharge status: Home / Self Care | Payer: 59 | Source: Ambulatory Visit | Attending: Family Medicine | Admitting: Family Medicine

## 2020-01-27 DIAGNOSIS — Z1231 Encounter for screening mammogram for malignant neoplasm of breast: Secondary | ICD-10-CM

## 2020-04-07 DIAGNOSIS — K5909 Other constipation: Secondary | ICD-10-CM | POA: Diagnosis not present

## 2020-05-04 ENCOUNTER — Other Ambulatory Visit (HOSPITAL_COMMUNITY): Payer: Self-pay | Admitting: Family Medicine

## 2020-05-07 MED FILL — ELETRIPTAN HYDROBROMIDE 40: 40 | 30 days supply | Qty: 6 | Fill #0

## 2020-06-12 MED FILL — FLUARIX QUADRIVALENT 0.5 ML: 0.5 | 1 days supply | Qty: 1 | Fill #0

## 2020-07-06 MED FILL — ELETRIPTAN HYDROBROMIDE 40: 40 | 30 days supply | Qty: 6 | Fill #1

## 2020-08-06 ENCOUNTER — Other Ambulatory Visit: Payer: Self-pay | Admitting: Physician Assistant

## 2020-08-06 ENCOUNTER — Other Ambulatory Visit: Payer: Self-pay | Admitting: Dermatology

## 2020-08-06 MED FILL — ELETRIPTAN HYDROBROMIDE 40: 40 | 30 days supply | Qty: 6 | Fill #2

## 2020-08-06 MED FILL — ALPRAZolam 0.25 MG TABS: 0.25 | 20 days supply | Qty: 20 | Fill #0

## 2020-08-06 MED FILL — DESOXIMETASONE 0.05 % OINT: 0.05 | 30 days supply | Qty: 60 | Fill #0

## 2020-08-18 DIAGNOSIS — D492 Neoplasm of unspecified behavior of bone, soft tissue, and skin: Secondary | ICD-10-CM | POA: Diagnosis not present

## 2020-10-30 DIAGNOSIS — Z0100 Encounter for examination of eyes and vision without abnormal findings: Secondary | ICD-10-CM | POA: Diagnosis not present

## 2020-12-16 ENCOUNTER — Ambulatory Visit (INDEPENDENT_AMBULATORY_CARE_PROVIDER_SITE_OTHER): Payer: 59 | Admitting: Family Medicine

## 2020-12-16 ENCOUNTER — Encounter: Payer: Self-pay | Admitting: Family Medicine

## 2020-12-16 ENCOUNTER — Other Ambulatory Visit: Payer: Self-pay

## 2020-12-16 VITALS — BP 120/84 | Ht 62.0 in | Wt 169.0 lb

## 2020-12-16 DIAGNOSIS — M79672 Pain in left foot: Secondary | ICD-10-CM

## 2020-12-16 NOTE — Progress Notes (Signed)
PCP: Kelton Pillar, MD  Subjective:   HPI: Patient is a 45 y.o. female here for right heel pain.  Patient has prior history of right plantar fasciitis - seen 3-4 years ago for this. Reports this started to recur in January of this year. She has continued to wear her custom orthotics, supportive shoes even around the house. Doing stretches but not currently doing eccentrics. Worse on hardwood floor. Pain plantar heel. Using roller and taking ibuprofen if needed.  Past Medical History:  Diagnosis Date  . 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 Atypical nevus 03/01/2016   mild-righ shoulder  . Migraine   . Pancreatitis aug 2015 and july 2017    Current Outpatient Medications on File Prior to Visit  Medication Sig Dispense Refill  . ALPRAZolam (XANAX) 0.25 MG tablet Take 0.25 mg by mouth 2 (two) times daily as needed for anxiety.    . calcium carbonate (OSCAL) 1500 (600 Ca) MG TABS tablet Take by mouth 2 (two) times daily. Plus vit d    . doxycycline (VIBRA-TABS) 100 MG tablet Take 1 tablet (100 mg total) by mouth 2 (two) times daily. 1 po bid 20 tablet 0  . eletriptan (RELPAX) 40 MG tablet TAKE 1 TABLET BY MOUTH AT HEADACHE ONSET/ MAY REPEAT IN 2 HOURS IF NEEDED MAX OF 2 TABS/24 HOURS    . Olopatadine HCl 0.2 % SOLN   3   No current facility-administered medications on file prior to visit.    Past Surgical History:  Procedure Laterality Date  . epidural for childbirth     x 2  . WISDOM TOOTH EXTRACTION      No Known Allergies  Social History   Socioeconomic History  . Marital status: Married    Spouse name: Not on file  . Number of children: 2  . Years of education: Not on file  . Highest education level: Not on file  Occupational History  . Occupation: stay at home mom  Tobacco Use  . Smoking status: Never Smoker  . Smokeless tobacco: Never Used  Substance and Sexual Activity  . Alcohol use: No  . Drug use: No  . Sexual activity: Yes    Comment: no  current birth control  Other Topics Concern  . Not on file  Social History Narrative  . Not on file   Social Determinants of Health   Financial Resource Strain: Not on file  Food Insecurity: Not on file  Transportation Needs: Not on file  Physical Activity: Not on file  Stress: Not on file  Social Connections: Not on file  Intimate Partner Violence: Not on file    Family History  Problem Relation Age of Onset  . Thyroid disease Father   . Thyroid disease Mother   . Migraines Sister   . Diverticulitis Sister     BP 120/84   Ht 5\' 2"  (1.575 m)   Wt 169 lb (76.7 kg)   BMI 30.91 kg/m   No flowsheet data found.  No flowsheet data found.  Review of Systems: See HPI above.     Objective:  Physical Exam:  Gen: NAD, comfortable in exam room  Right foot/ankle: No gross deformity, swelling, ecchymoses.  Mild pronation FROM TTP medial plantar calcaneus at plantar fascia insertion Negative ant drawer and negative talar tilt.   Negative syndesmotic compression and calcaneal squeeze. Thompsons test negative. NV intact distally.   Assessment & Plan:  1. Right plantar fasciitis - home exercises and stretches reviewed.  Arch binder.  Her orthotics are still in good shape - continue these.  Avoid barefoot walking, flat shoes.  Strassburg sock.  Consider formal physical therapy, injection if she's struggling.  F/u in 6 weeks.

## 2020-12-16 NOTE — Patient Instructions (Signed)
You have plantar fasciitis Take tylenol and/or aleve as needed for pain  Plantar fascia stretch for 20-30 seconds (do 3 of these) in morning at least, can do other times during the day as well. Lowering/raise on a step exercises 3 x 10 once or twice a day - this is very important for long term recovery. Can add heel walks, toe walks forward and backward as well Ice heel for 15 minutes as needed. Avoid flat shoes/barefoot walking as much as possible. Arch straps have been shown to help with pain. Inserts are important (dr. Zoe Lan active series, spencos, our green insoles, custom orthotics) - continue with these. Steroid injection is a consideration for short term pain relief if you are struggling. Strassburg sock when sleeping. Physical therapy is also an option. Follow up with Miranda Pena in 6 weeks but call us sooner if you're struggling and want to do physical therapy or the injection.

## 2020-12-28 ENCOUNTER — Ambulatory Visit (INDEPENDENT_AMBULATORY_CARE_PROVIDER_SITE_OTHER): Payer: 59 | Admitting: Sports Medicine

## 2020-12-28 ENCOUNTER — Ambulatory Visit (INDEPENDENT_AMBULATORY_CARE_PROVIDER_SITE_OTHER): Payer: 59

## 2020-12-28 ENCOUNTER — Other Ambulatory Visit: Payer: Self-pay

## 2020-12-28 DIAGNOSIS — M722 Plantar fascial fibromatosis: Secondary | ICD-10-CM

## 2020-12-28 NOTE — Assessment & Plan Note (Signed)
Chronic right foot pain, present on the heel, worse with the first few steps in the morning, at this point has tried custom orthotics, nighttime extension splinting, arch straps without much improvement. Today we injected the plantar fascia origin, adding an air heel brace as well, home rehab exercises. Return in a month.

## 2020-12-28 NOTE — Progress Notes (Signed)
    Procedures performed today:    Procedure: Real-time Ultrasound Guided injection of the right plantar fascia origin Device: Samsung HS60  Verbal informed consent obtained.  Time-out conducted.  Noted no overlying erythema, induration, or other signs of local infection.  Skin prepped in a sterile fashion.  Local anesthesia: Topical Ethyl chloride.  With sterile technique and under real time ultrasound guidance:  Noted thickened plantar fascia origin, 1 cc Kenalog 40, 1 cc lidocaine, 1 cc bupivacaine injected easily Completed without difficulty  Advised to call if fevers/chills, erythema, induration, drainage, or persistent bleeding.  Images permanently stored and available for review in PACS.  Impression: Technically successful ultrasound guided injection.  Independent interpretation of notes and tests performed by another provider:   None.  Brief History, Exam, Impression, and Recommendations:    Plantar fasciitis, right Chronic right foot pain, present on the heel, worse with the first few steps in the morning, at this point has tried custom orthotics, nighttime extension splinting, arch straps without much improvement. Today we injected the plantar fascia origin, adding an air heel brace as well, home rehab exercises. Return in a month.    ___________________________________________ Gwen Her. Dianah Field, M.D., ABFM., CAQSM. Primary Care and [place] Instructor of [place] of [facility]

## 2020-12-29 NOTE — Telephone Encounter (Signed)
Please see mychart message.

## 2021-01-01 ENCOUNTER — Other Ambulatory Visit: Payer: Self-pay | Admitting: Family Medicine

## 2021-01-01 DIAGNOSIS — Z1231 Encounter for screening mammogram for malignant neoplasm of breast: Secondary | ICD-10-CM

## 2021-01-04 ENCOUNTER — Ambulatory Visit: Payer: 59 | Admitting: Sports Medicine

## 2021-01-05 ENCOUNTER — Other Ambulatory Visit (HOSPITAL_COMMUNITY): Payer: Self-pay

## 2021-01-25 ENCOUNTER — Ambulatory Visit: Payer: 59 | Admitting: Sports Medicine

## 2021-01-28 ENCOUNTER — Ambulatory Visit: Payer: 59 | Admitting: Sports Medicine

## 2021-01-29 DIAGNOSIS — F411 Generalized anxiety disorder: Secondary | ICD-10-CM | POA: Diagnosis not present

## 2021-01-29 DIAGNOSIS — Z13220 Encounter for screening for lipoid disorders: Secondary | ICD-10-CM | POA: Diagnosis not present

## 2021-01-29 DIAGNOSIS — Z0000 Encounter for general adult medical examination without abnormal findings: Secondary | ICD-10-CM | POA: Diagnosis not present

## 2021-01-29 DIAGNOSIS — G43909 Migraine, unspecified, not intractable, without status migrainosus: Secondary | ICD-10-CM | POA: Diagnosis not present

## 2021-01-29 DIAGNOSIS — L409 Psoriasis, unspecified: Secondary | ICD-10-CM | POA: Diagnosis not present

## 2021-01-29 DIAGNOSIS — N946 Dysmenorrhea, unspecified: Secondary | ICD-10-CM | POA: Diagnosis not present

## 2021-01-29 DIAGNOSIS — Z131 Encounter for screening for diabetes mellitus: Secondary | ICD-10-CM | POA: Diagnosis not present

## 2021-02-23 ENCOUNTER — Other Ambulatory Visit: Payer: Self-pay

## 2021-02-23 ENCOUNTER — Ambulatory Visit
Admission: RE | Admit: 2021-02-23 | Discharge: 2021-02-23 | Disposition: A | Discharge status: Home / Self Care | Payer: 59 | Source: Ambulatory Visit | Attending: Family Medicine | Admitting: Family Medicine

## 2021-02-23 DIAGNOSIS — Z1231 Encounter for screening mammogram for malignant neoplasm of breast: Secondary | ICD-10-CM | POA: Diagnosis not present

## 2021-04-02 ENCOUNTER — Other Ambulatory Visit (HOSPITAL_COMMUNITY): Payer: Self-pay

## 2021-04-02 MED ORDER — LO LOESTRIN FE 1 MG-10 MCG / 10 MCG PO TABS
ORAL_TABLET | ORAL | 12 refills | Status: AC
  Filled 2021-04-02 (×2): qty 28, 28d supply, fill #0

## 2021-04-06 ENCOUNTER — Other Ambulatory Visit (HOSPITAL_COMMUNITY): Payer: Self-pay

## 2021-04-26 ENCOUNTER — Other Ambulatory Visit: Payer: Self-pay

## 2021-04-26 ENCOUNTER — Other Ambulatory Visit (HOSPITAL_COMMUNITY): Payer: Self-pay

## 2021-04-26 ENCOUNTER — Other Ambulatory Visit (HOSPITAL_BASED_OUTPATIENT_CLINIC_OR_DEPARTMENT_OTHER): Payer: Self-pay

## 2021-04-26 MED ORDER — ELETRIPTAN HYDROBROMIDE 40 MG PO TABS
ORAL_TABLET | ORAL | 11 refills | Status: AC
  Filled 2021-04-26: qty 6, 30d supply, fill #0
  Filled 2021-09-30: qty 6, 30d supply, fill #1
  Filled 2021-12-14: qty 6, 30d supply, fill #2
  Filled 2022-02-14: qty 6, 30d supply, fill #3

## 2021-04-27 ENCOUNTER — Other Ambulatory Visit (HOSPITAL_COMMUNITY): Payer: Self-pay

## 2021-04-28 ENCOUNTER — Other Ambulatory Visit (HOSPITAL_COMMUNITY): Payer: Self-pay

## 2021-04-28 MED ORDER — ALPRAZOLAM 0.25 MG PO TABS
ORAL_TABLET | ORAL | 0 refills | Status: AC
  Filled 2021-04-28: qty 20, 20d supply, fill #0

## 2021-05-03 DIAGNOSIS — N946 Dysmenorrhea, unspecified: Secondary | ICD-10-CM | POA: Diagnosis not present

## 2021-05-03 DIAGNOSIS — Z124 Encounter for screening for malignant neoplasm of cervix: Secondary | ICD-10-CM | POA: Diagnosis not present

## 2021-05-03 DIAGNOSIS — R399 Unspecified symptoms and signs involving the genitourinary system: Secondary | ICD-10-CM | POA: Diagnosis not present

## 2021-05-17 ENCOUNTER — Ambulatory Visit (INDEPENDENT_AMBULATORY_CARE_PROVIDER_SITE_OTHER): Payer: 59 | Admitting: Sports Medicine

## 2021-05-17 ENCOUNTER — Ambulatory Visit (INDEPENDENT_AMBULATORY_CARE_PROVIDER_SITE_OTHER): Payer: 59

## 2021-05-17 ENCOUNTER — Other Ambulatory Visit: Payer: Self-pay

## 2021-05-17 DIAGNOSIS — M722 Plantar fascial fibromatosis: Secondary | ICD-10-CM

## 2021-05-17 NOTE — Assessment & Plan Note (Addendum)
This is a pleasant 45 year old female with chronic right foot pain, present on the heel, worse in the mornings, she has tried custom orthotics, nighttime splinting, arch straps, I injected her plantar fascial origin back in April and she did really well, she also has been wearing the air heel brace, doing her home rehab exercises. Repeat injection today due to recurrence of pain, return as needed. Of note we did discuss PRP and surgical consultation as well.

## 2021-05-17 NOTE — Progress Notes (Signed)
    Procedures performed today:    Procedure: Real-time Ultrasound Guided injection of the right plantar fascia origin Device: Samsung HS60  Verbal informed consent obtained.  Time-out conducted.  Noted no overlying erythema, induration, or other signs of local infection.  Skin prepped in a sterile fashion.  Local anesthesia: Topical Ethyl chloride.  With sterile technique and under real time ultrasound guidance:  Noted thickened plantar fascia origin, 1 cc Kenalog 40, 1 cc lidocaine, 1 cc bupivacaine injected easily Completed without difficulty  Advised to call if fevers/chills, erythema, induration, drainage, or persistent bleeding.  Images permanently stored and available for review in PACS.  Impression: Technically successful ultrasound guided injection.  Independent interpretation of notes and tests performed by another provider:   None.  Brief History, Exam, Impression, and Recommendations:    Plantar fasciitis, right This is a pleasant 45 year old female with chronic right foot pain, present on the heel, worse in the mornings, she has tried custom orthotics, nighttime splinting, arch straps, I injected her plantar fascial origin back in April and she did really well, she also has been wearing the air heel brace, doing her home rehab exercises. Repeat injection today due to recurrence of pain, return as needed. Of note we did discuss PRP and surgical consultation as well.   ___________________________________________ Gwen Her. Dianah Field, M.D., ABFM., CAQSM. Primary Care and [place] Instructor of [place] of [facility] of [facility]

## 2021-07-20 ENCOUNTER — Ambulatory Visit: Payer: 59 | Admitting: Physician Assistant

## 2021-07-23 ENCOUNTER — Telehealth: Payer: 59 | Admitting: Nurse Practitioner

## 2021-07-23 ENCOUNTER — Other Ambulatory Visit (HOSPITAL_COMMUNITY): Payer: Self-pay

## 2021-07-23 DIAGNOSIS — R6889 Other general symptoms and signs: Secondary | ICD-10-CM | POA: Diagnosis not present

## 2021-07-23 MED ORDER — OSELTAMIVIR PHOSPHATE 75 MG PO CAPS
75.0000 mg | ORAL_CAPSULE | Freq: Two times a day (BID) | ORAL | 0 refills | Status: DC
  Filled 2021-07-23: qty 10, 5d supply, fill #0

## 2021-07-23 NOTE — Progress Notes (Signed)

## 2021-09-14 ENCOUNTER — Other Ambulatory Visit: Payer: Self-pay | Admitting: Dermatology

## 2021-09-14 ENCOUNTER — Other Ambulatory Visit (HOSPITAL_COMMUNITY): Payer: Self-pay

## 2021-09-15 ENCOUNTER — Other Ambulatory Visit (HOSPITAL_COMMUNITY): Payer: Self-pay

## 2021-09-30 ENCOUNTER — Other Ambulatory Visit (HOSPITAL_COMMUNITY): Payer: Self-pay

## 2021-10-12 ENCOUNTER — Other Ambulatory Visit (HOSPITAL_COMMUNITY): Payer: Self-pay

## 2021-10-12 ENCOUNTER — Telehealth: Payer: 59 | Admitting: Physician Assistant

## 2021-10-12 DIAGNOSIS — J019 Acute sinusitis, unspecified: Secondary | ICD-10-CM

## 2021-10-12 DIAGNOSIS — B9689 Other specified bacterial agents as the cause of diseases classified elsewhere: Secondary | ICD-10-CM | POA: Diagnosis not present

## 2021-10-12 MED ORDER — AMOXICILLIN-POT CLAVULANATE 875-125 MG PO TABS
1.0000 | ORAL_TABLET | Freq: Two times a day (BID) | ORAL | 0 refills | Status: DC
  Filled 2021-10-12: qty 14, 7d supply, fill #0

## 2021-10-12 NOTE — Progress Notes (Signed)

## 2021-10-12 NOTE — Progress Notes (Signed)
I have spent 5 minutes in review of e-visit questionnaire, review and updating patient chart, medical decision making and response to patient.   Ercelle Winkles Cody Jonnie Kubly, PA-C    

## 2021-11-02 DIAGNOSIS — Z0100 Encounter for examination of eyes and vision without abnormal findings: Secondary | ICD-10-CM | POA: Diagnosis not present

## 2021-11-09 ENCOUNTER — Ambulatory Visit: Payer: 59 | Admitting: Physician Assistant

## 2021-11-15 ENCOUNTER — Other Ambulatory Visit (HOSPITAL_COMMUNITY): Payer: Self-pay

## 2021-11-15 ENCOUNTER — Ambulatory Visit (INDEPENDENT_AMBULATORY_CARE_PROVIDER_SITE_OTHER): Payer: 59 | Admitting: Physician Assistant

## 2021-11-15 ENCOUNTER — Other Ambulatory Visit: Payer: Self-pay

## 2021-11-15 ENCOUNTER — Encounter: Payer: Self-pay | Admitting: Physician Assistant

## 2021-11-15 DIAGNOSIS — Z86018 Personal history of other benign neoplasm: Secondary | ICD-10-CM | POA: Diagnosis not present

## 2021-11-15 DIAGNOSIS — Z1283 Encounter for screening for malignant neoplasm of skin: Secondary | ICD-10-CM

## 2021-11-15 DIAGNOSIS — L409 Psoriasis, unspecified: Secondary | ICD-10-CM | POA: Diagnosis not present

## 2021-11-15 MED ORDER — DESOXIMETASONE 0.05 % EX OINT
TOPICAL_OINTMENT | CUTANEOUS | 9 refills | Status: AC
  Filled 2021-11-15: qty 100, 30d supply, fill #0
  Filled 2021-12-14: qty 100, 30d supply, fill #1
  Filled 2022-02-14: qty 100, 30d supply, fill #2

## 2021-11-15 MED ORDER — BETAMETHASONE VALERATE 0.12 % EX FOAM
CUTANEOUS | 9 refills | Status: AC
  Filled 2021-11-15 – 2021-12-14 (×2): qty 100, 30d supply, fill #0
  Filled 2022-02-14: qty 100, 30d supply, fill #1

## 2021-11-15 NOTE — Progress Notes (Signed)
° °  Follow-Up Visit   Subjective  Miranda Pena is a 46 y.o. female who presents for the following: Annual Exam (Full body skin check personal history of mild atypia. Patient  needs refills of betamethasone foam for scalp eczema, desoximethasone oint 0.05% fro dry skin in the on the groin area).   The following portions of the chart were reviewed this encounter and updated as appropriate:  Tobacco   Allergies   Meds   Problems   Med Hx   Surg Hx   Fam Hx       Objective  Well appearing patient in no apparent distress; mood and affect are within normal limits.  A full examination was performed including scalp, head, eyes, ears, nose, lips, neck, chest, axillae, abdomen, back, buttocks, bilateral upper extremities, bilateral lower extremities, hands, feet, fingers, toes, fingernails, and toenails. All findings within normal limits unless otherwise noted below.  head to toe No atypical nevi or signs of NMSC noted at the time of the visit.   mid frontal scalp and extremities Thin scaly erythematous plaques.    Assessment & Plan  Encounter for screening for malignant neoplasm of skin head to toe  Yearly skin examination  Psoriasis mid frontal scalp and groin  Betamethasone Valerate 0.12 % foam - mid frontal scalp and extremities Apply to affected area every day.  Desoximetasone (TOPICORT) 0.05 % OINT - mid frontal scalp and extremities Apply to affected areas every day.    I, Serafin [place], PA-C, have reviewed all documentation's for this visit.  The documentation on 11/15/21 for the exam, diagnosis, procedures and orders are all accurate and complete.

## 2021-11-16 ENCOUNTER — Other Ambulatory Visit (HOSPITAL_COMMUNITY): Payer: Self-pay

## 2021-11-29 ENCOUNTER — Encounter: Payer: Self-pay | Admitting: Sports Medicine

## 2021-12-06 ENCOUNTER — Ambulatory Visit: Payer: Self-pay | Admitting: Family Medicine

## 2021-12-08 ENCOUNTER — Ambulatory Visit (INDEPENDENT_AMBULATORY_CARE_PROVIDER_SITE_OTHER): Payer: Self-pay | Admitting: Family Medicine

## 2021-12-08 DIAGNOSIS — M722 Plantar fascial fibromatosis: Secondary | ICD-10-CM

## 2021-12-08 NOTE — Progress Notes (Signed)
Patient comes in as referral from Dr. Dianah Field for ECSWT of her right plantar fasciitis.  She has been doing home exercises, stretches, strassburg sock, arch supports, had two injections and still with pain medial proximal plantar fascia.   ? ?Procedure: ECSWT ?Indications:  right plantar fasciitis ?  ?Procedure Details ?Consent: Risks of procedure as well as the alternatives and risks of each were explained to the patient.  Written consent for procedure obtained. ?Time Out: Verified patient identification, verified procedure, site was marked, verified correct patient position, medications/allergies/relevent history reviewed.  The area was cleaned with alcohol swab.   ?  ?The right plantar fascia was targeted for Extracorporeal shockwave therapy.  ?  ?Preset: plantar fasciitis ?Power Level: 90 ?Frequency: 7 ?Impulse/cycles: 2500 ?Head size: large ?  ?Patient tolerated procedure well without immediate complications.  Plan for 4 treatments, f/u in 1 week for second treatment. ?  ? ?

## 2021-12-15 ENCOUNTER — Ambulatory Visit (INDEPENDENT_AMBULATORY_CARE_PROVIDER_SITE_OTHER): Payer: 59 | Admitting: Family Medicine

## 2021-12-15 ENCOUNTER — Other Ambulatory Visit (HOSPITAL_COMMUNITY): Payer: Self-pay

## 2021-12-15 DIAGNOSIS — M722 Plantar fascial fibromatosis: Secondary | ICD-10-CM

## 2021-12-15 NOTE — Progress Notes (Signed)
Patient reports she's doing well - plantar fascia not as sore as last week before the treatment. ?Had some numbness initially from the treatment but no other effects. ?Today's treatment is second one. ? ?Procedure: ECSWT ?Indications:  right plantar fasciitis ?  ?Procedure Details ?Consent: Risks of procedure as well as the alternatives and risks of each were explained to the patient.  Written consent for procedure obtained. ?Time Out: Verified patient identification, verified procedure, site was marked, verified correct patient position, medications/allergies/relevent history reviewed.  The area was cleaned with alcohol swab.   ?  ?The right plantar fascia was targeted for Extracorporeal shockwave therapy.  ?  ?Preset: plantar fasciitis ?Power Level: 90 ?Frequency: 10 ?Impulse/cycles: 2500 ?Head size: large ?  ?Patient tolerated procedure well without immediate complications ?  ? ?

## 2021-12-16 ENCOUNTER — Other Ambulatory Visit (HOSPITAL_COMMUNITY): Payer: Self-pay

## 2021-12-17 ENCOUNTER — Other Ambulatory Visit (HOSPITAL_COMMUNITY): Payer: Self-pay

## 2021-12-20 ENCOUNTER — Other Ambulatory Visit (HOSPITAL_COMMUNITY): Payer: Self-pay

## 2021-12-21 ENCOUNTER — Ambulatory Visit (INDEPENDENT_AMBULATORY_CARE_PROVIDER_SITE_OTHER): Payer: Self-pay | Admitting: Sports Medicine

## 2021-12-21 ENCOUNTER — Other Ambulatory Visit (HOSPITAL_COMMUNITY): Payer: Self-pay

## 2021-12-21 DIAGNOSIS — M722 Plantar fascial fibromatosis: Secondary | ICD-10-CM

## 2021-12-21 MED ORDER — ALPRAZOLAM 0.25 MG PO TABS
ORAL_TABLET | ORAL | 0 refills | Status: AC
  Filled 2021-12-21: qty 20, 20d supply, fill #0

## 2021-12-21 NOTE — Assessment & Plan Note (Signed)
Under treatment for PF with ESWT now on session 3 ?

## 2021-12-21 NOTE — Progress Notes (Signed)
Subjective: Miranda Pena is doing well in terms of her right plantar fasciitis.  She presents today for shockwave session #3.  She states she is probably about 60% improved. ? ?Procedure: ECSWT ?Indications: Plantar fasciitis right ?  ?Procedure Details ?Consent: Risks of procedure as well as the alternatives and risks of each were explained to the patient.  Written consent for procedure obtained. ?Time Out: Verified patient identification, verified procedure, site was marked, verified correct patient position, medications/allergies/relevent history reviewed.  The area was cleaned with alcohol swab.   ?  ?The right plantar fascia was targeted for Extracorporeal shockwave therapy.  ?  ?Preset: Plantar fasciitis ?Power Level: 90 ?Frequency: 10 ?Impulse/cycles: 2500 ?Head size: Large ?  ?Patient tolerated procedure well without immediate complications.  She will follow-up next week for her fourth shockwave session. ? ?Elba Barman, DO ?PGY-4, Sports Medicine Fellow ?[place] ? ?This note was dictated using Dragon naturally speaking software and may contain errors in syntax, spelling, or content which have not been identified prior to signing this note.  ? ?  ? ?

## 2021-12-22 ENCOUNTER — Ambulatory Visit: Payer: Self-pay | Admitting: Family Medicine

## 2021-12-28 ENCOUNTER — Ambulatory Visit: Payer: Self-pay | Admitting: Sports Medicine

## 2022-01-06 ENCOUNTER — Ambulatory Visit (INDEPENDENT_AMBULATORY_CARE_PROVIDER_SITE_OTHER): Payer: Self-pay | Admitting: Sports Medicine

## 2022-01-06 VITALS — Ht 62.0 in

## 2022-01-06 DIAGNOSIS — M722 Plantar fascial fibromatosis: Secondary | ICD-10-CM

## 2022-01-06 NOTE — Progress Notes (Signed)
Patient ID: Miranda Pena, female   DOB: May 15, 1976, 46 y.o.   MRN: [id number] ? ?Jenipher presents today for her fourth soundwave treatment for right heel Planter fasciitis.  She continues to note improvement.  She states that she is about 80% improved.  Treatment performed as below.  We will hold on further treatments at this time but Shekinah may elect to call the office in a few weeks if her symptoms do not continue to improve. ? ?Procedure: ECSWT ?Indications: Planter fasciitis ?  ?Procedure Details ?Consent: Risks of procedure as well as the alternatives and risks of each were explained to the patient.  Written consent for procedure obtained. ?Time Out: Verified patient identification, verified procedure, site was marked, verified correct patient position, medications/allergies/relevent history reviewed.  The area was cleaned with alcohol swab.   ?  ?The right plantar fascia was targeted for Extracorporeal shockwave therapy.  ?  ?Preset: Planter fasciitis ?Power Level: 90 ?Frequency: 10 ?Impulse/cycles: 2500 ?Head size: Large ?  ?Patient tolerated procedure well without immediate complications ?   ?

## 2022-01-12 ENCOUNTER — Ambulatory Visit (INDEPENDENT_AMBULATORY_CARE_PROVIDER_SITE_OTHER): Payer: Self-pay | Admitting: Sports Medicine

## 2022-01-12 DIAGNOSIS — M722 Plantar fascial fibromatosis: Secondary | ICD-10-CM

## 2022-01-12 NOTE — Progress Notes (Signed)
Subjective: Miranda Pena is a pleasant 46 year old female who presents today for shockwave therapy #5 today for plantar fasciitis.  She reports she has got significant relief with shockwave therapy, feels like she is about 80-85% improved at this time.  ? ?Procedure: ECSWT ?Indications: Planter fasciitis ?  ?Procedure Details ?Consent: Risks of procedure as well as the alternatives and risks of each were explained to the patient.  Written consent for procedure obtained. ?Time Out: Verified patient identification, verified procedure, site was marked, verified correct patient position, medications/allergies/relevent history reviewed.  The area was cleaned with alcohol swab.   ?  ?The right plantar fascia was targeted for Extracorporeal shockwave therapy.  ?  ?Preset: Plantar fasciitis ?Power Level: 90 -> 100 ?Frequency: 10 ?Impulse/cycles: 2500 ?Head size: Large ?  ?Patient tolerated procedure well without immediate complications.  She will follow-up next week for her sixth and final session.  ? ?Elba Barman, DO ?PGY-4, Sports Medicine Fellow ?[place] ? ?This note was dictated using Dragon naturally speaking software and may contain errors in syntax, spelling, or content which have not been identified prior to signing this note.  ? ?Addendum:  I was the preceptor for this visit and available for immediate consultation.  Karlton Lemon MD CAQSM ? ?

## 2022-01-20 ENCOUNTER — Ambulatory Visit (INDEPENDENT_AMBULATORY_CARE_PROVIDER_SITE_OTHER): Payer: Self-pay | Admitting: Sports Medicine

## 2022-01-20 VITALS — Ht 62.0 in

## 2022-01-20 DIAGNOSIS — M722 Plantar fascial fibromatosis: Secondary | ICD-10-CM

## 2022-01-20 NOTE — Progress Notes (Signed)
Patient ID: Miranda Pena, female   DOB: May 22, 1976, 46 y.o.   MRN: [id number] ? ?Patient presents for her sixth and final shockwave treatment for her right plantar fasciitis.  She continues to do well.  Treatment performed as below.  She will schedule future treatments as needed. ? ?Procedure: ECSWT ?Indications: Planter fasciitis ?  ?Procedure Details ?Consent: Risks of procedure as well as the alternatives and risks of each were explained to the patient.  Written consent for procedure obtained. ?Time Out: Verified patient identification, verified procedure, site was marked, verified correct patient position, medications/allergies/relevent history reviewed.  The area was cleaned with alcohol swab.   ?  ?The right plantar fascia was targeted for Extracorporeal shockwave therapy.  ?  ?Preset: Planter fasciitis ?Power Level: 100 ?Frequency: 10 ?Impulse/cycles: 2500 ?Head size: Large ?  ?Patient tolerated procedure well without immediate complications ?  ? ?

## 2022-01-26 ENCOUNTER — Other Ambulatory Visit: Payer: Self-pay | Admitting: Family Medicine

## 2022-01-26 DIAGNOSIS — Z1231 Encounter for screening mammogram for malignant neoplasm of breast: Secondary | ICD-10-CM

## 2022-02-14 ENCOUNTER — Other Ambulatory Visit (HOSPITAL_COMMUNITY): Payer: Self-pay

## 2022-02-15 ENCOUNTER — Other Ambulatory Visit (HOSPITAL_COMMUNITY): Payer: Self-pay

## 2022-02-16 ENCOUNTER — Other Ambulatory Visit (HOSPITAL_COMMUNITY): Payer: Self-pay

## 2022-02-16 DIAGNOSIS — E669 Obesity, unspecified: Secondary | ICD-10-CM | POA: Diagnosis not present

## 2022-02-16 DIAGNOSIS — F411 Generalized anxiety disorder: Secondary | ICD-10-CM | POA: Diagnosis not present

## 2022-02-16 DIAGNOSIS — G43909 Migraine, unspecified, not intractable, without status migrainosus: Secondary | ICD-10-CM | POA: Diagnosis not present

## 2022-02-16 DIAGNOSIS — Z0000 Encounter for general adult medical examination without abnormal findings: Secondary | ICD-10-CM | POA: Diagnosis not present

## 2022-02-16 DIAGNOSIS — E7800 Pure hypercholesterolemia, unspecified: Secondary | ICD-10-CM | POA: Diagnosis not present

## 2022-02-16 DIAGNOSIS — Z6831 Body mass index (BMI) 31.0-31.9, adult: Secondary | ICD-10-CM | POA: Diagnosis not present

## 2022-02-16 MED ORDER — ALPRAZOLAM 0.25 MG PO TABS
0.2500 mg | ORAL_TABLET | Freq: Every day | ORAL | 0 refills | Status: AC | PRN
  Filled 2022-02-16: qty 20, 20d supply, fill #0

## 2022-02-17 ENCOUNTER — Other Ambulatory Visit (HOSPITAL_COMMUNITY): Payer: Self-pay

## 2022-02-24 ENCOUNTER — Ambulatory Visit: Payer: 59

## 2022-03-14 ENCOUNTER — Ambulatory Visit
Admission: RE | Admit: 2022-03-14 | Discharge: 2022-03-14 | Disposition: A | Discharge status: Home / Self Care | Payer: No Typology Code available for payment source | Source: Ambulatory Visit | Attending: Family Medicine | Admitting: Family Medicine

## 2022-03-14 DIAGNOSIS — Z1231 Encounter for screening mammogram for malignant neoplasm of breast: Secondary | ICD-10-CM

## 2023-05-18 IMAGING — MG MM DIGITAL SCREENING BILAT W/ TOMO AND CAD
8 series · 8 of 24 positions shown · non-contrast
Comparison: Previous exam(s).

CLINICAL DATA: Screening.

EXAM:
DIGITAL SCREENING BILATERAL MAMMOGRAM WITH TOMOSYNTHESIS AND CAD
TECHNIQUE: Bilateral screening digital craniocaudal and mediolateral oblique
mammograms were obtained. Bilateral screening digital breast
tomosynthesis was performed. The images were evaluated with
computer-aided detection.

[L CC synth-2D]
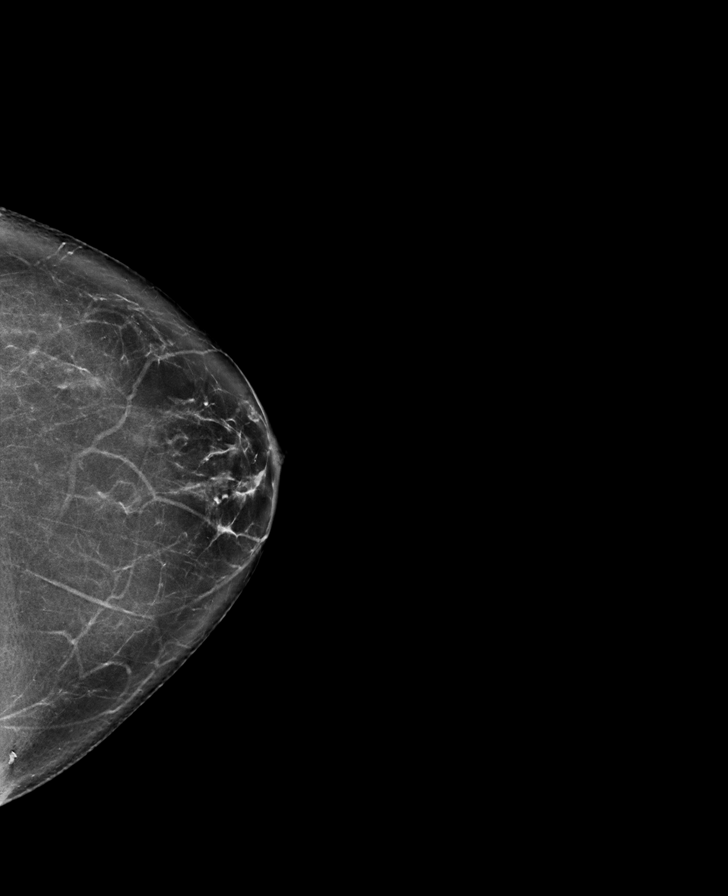

[L MLO synth-2D]
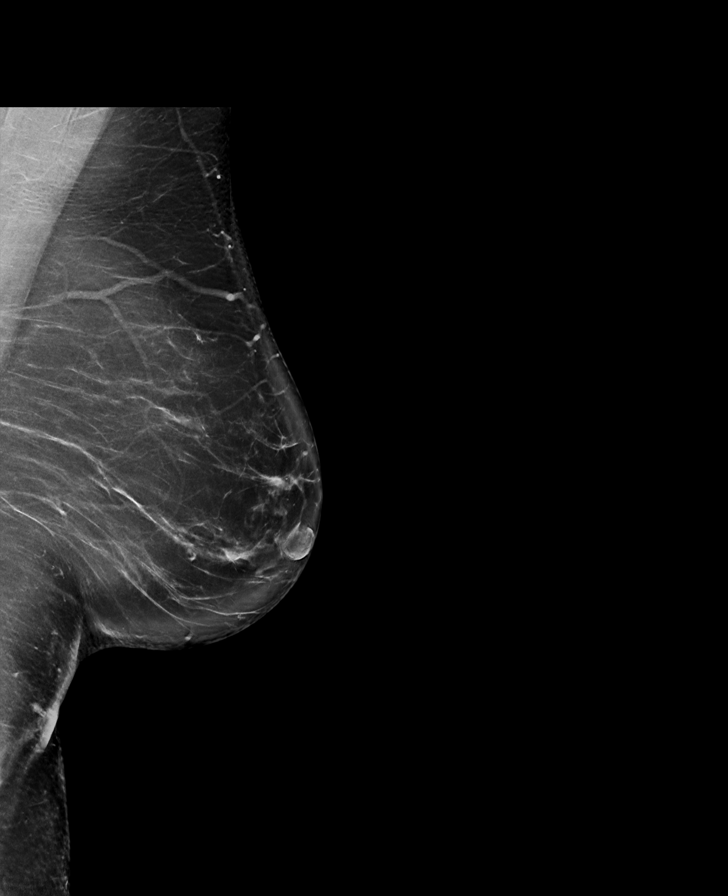

[R CC synth-2D]
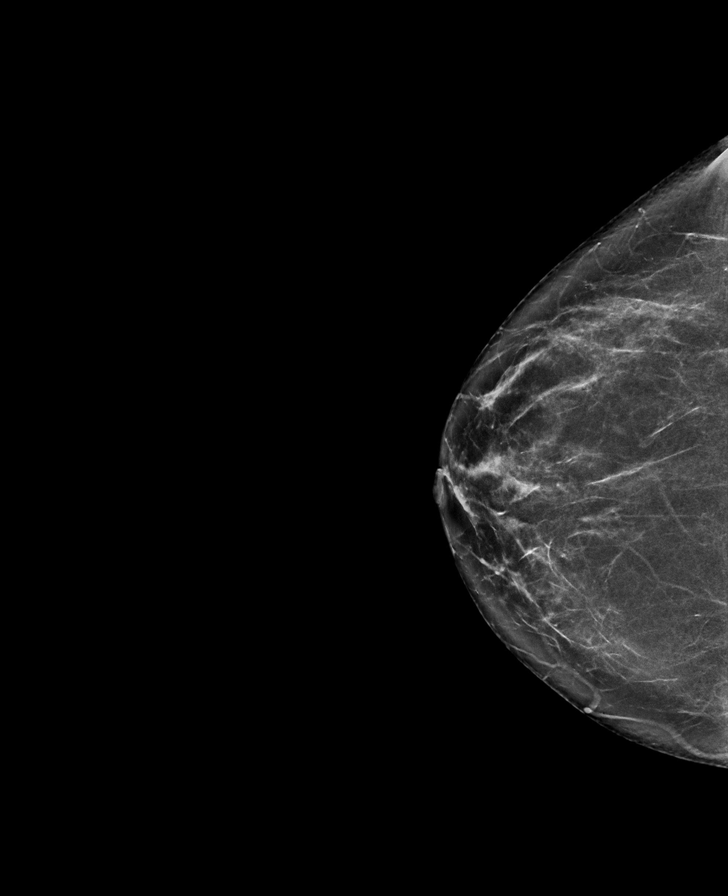

[R MLO synth-2D]
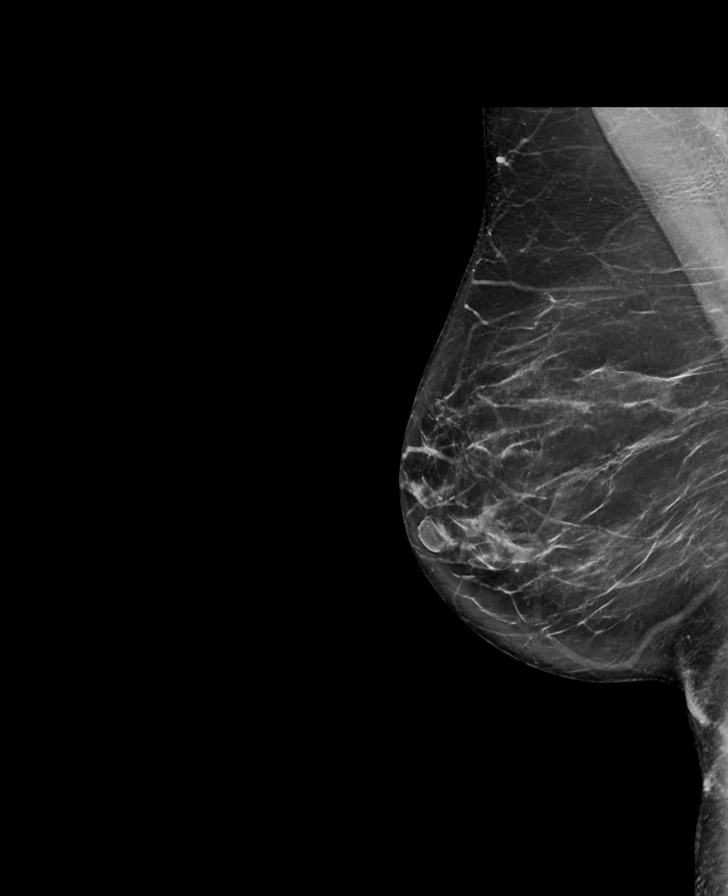

[L CC tomo · tomo slice 37/73.0]
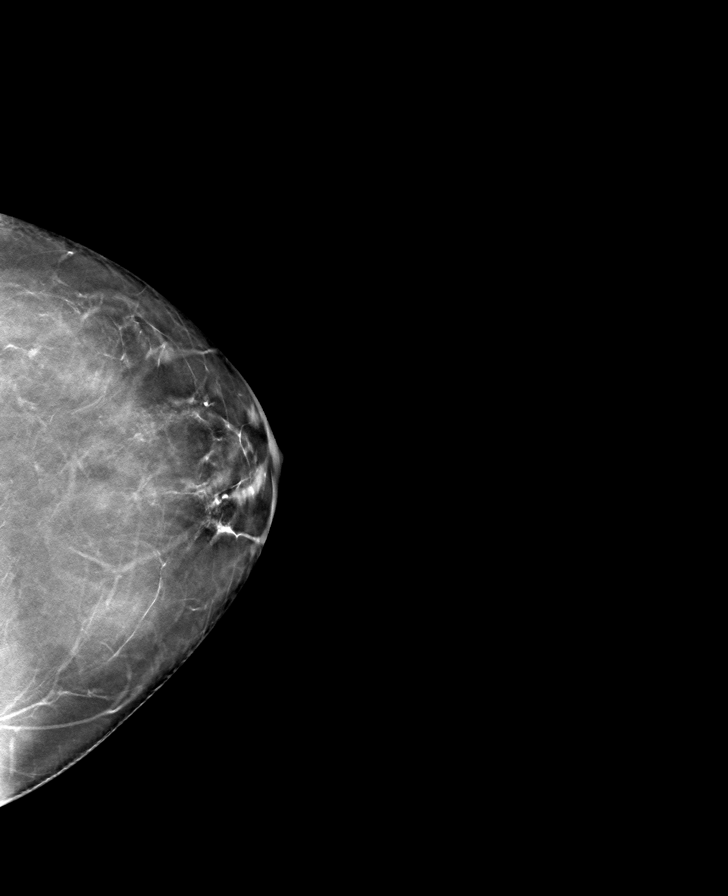

[R MLO tomo · tomo slice 43/85.0]
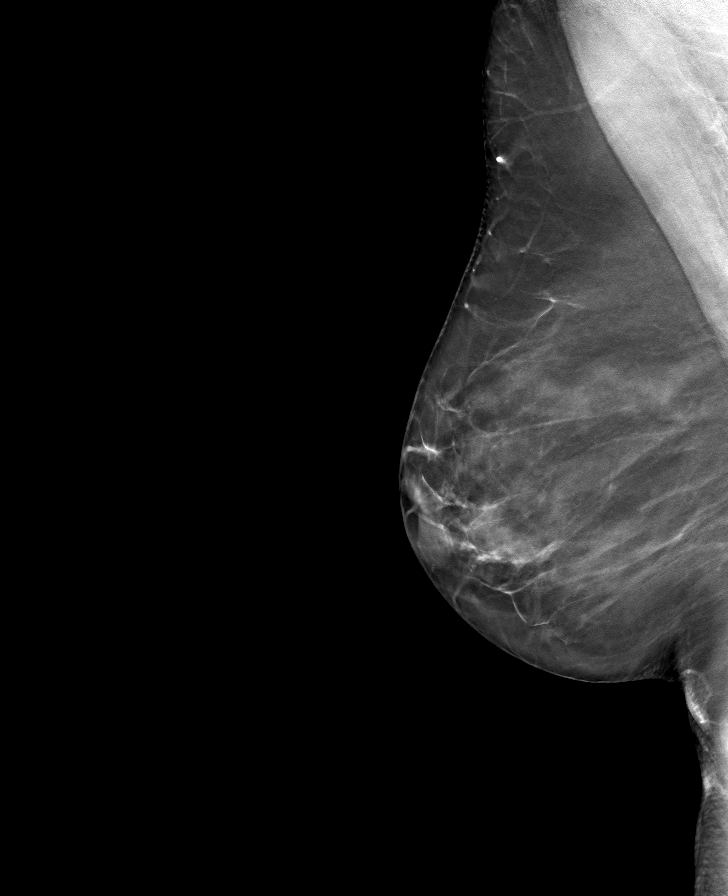

[R CC tomo · tomo slice 37/73.0]
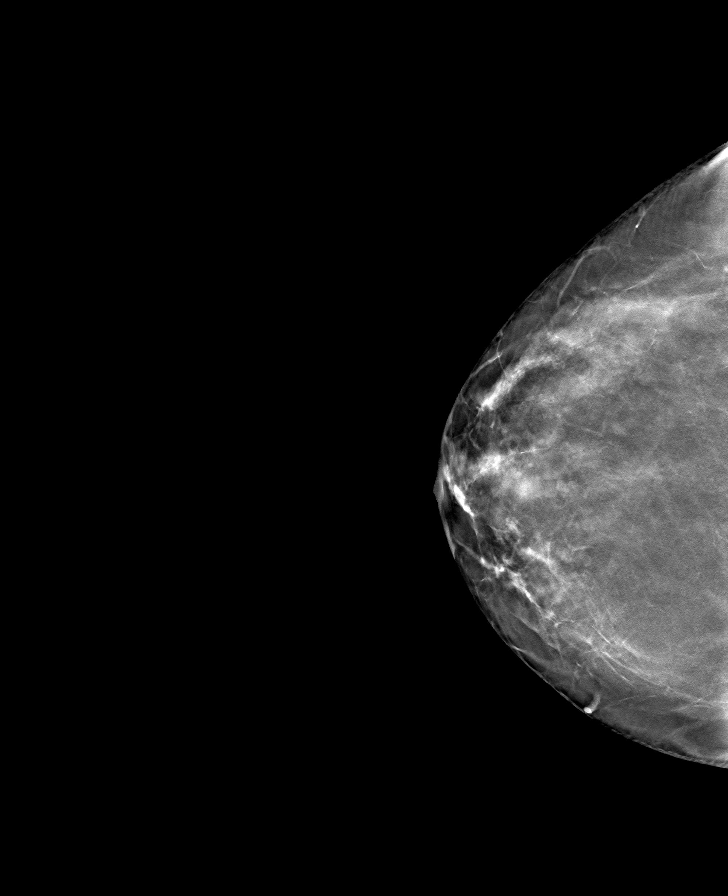

[L MLO tomo · tomo slice 46/91.0]
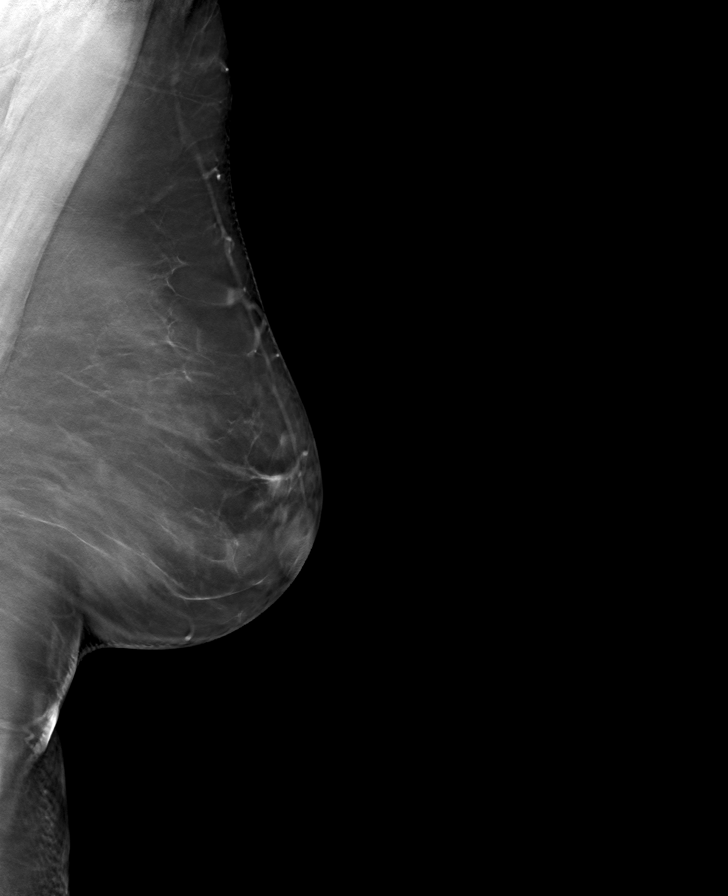

[8 of 24 positions shown; findings below may reference images not displayed]

ACR Breast Density Category b: There are scattered areas of
fibroglandular density.
FINDINGS: There are no findings suspicious for malignancy. The images were
evaluated with computer-aided detection.
IMPRESSION: No mammographic evidence of malignancy. A result letter of this
screening mammogram will be mailed directly to the patient.

RECOMMENDATION:
Screening mammogram in one year. (Code:WJ-[id number])

BI-RADS CATEGORY  1: Negative.

## 2024-03-13 ENCOUNTER — Telehealth: Admitting: Physician Assistant

## 2024-03-13 DIAGNOSIS — J019 Acute sinusitis, unspecified: Secondary | ICD-10-CM | POA: Diagnosis not present

## 2024-03-13 DIAGNOSIS — B9689 Other specified bacterial agents as the cause of diseases classified elsewhere: Secondary | ICD-10-CM | POA: Diagnosis not present

## 2024-03-14 MED ORDER — AMOXICILLIN-POT CLAVULANATE 875-125 MG PO TABS: 1.0000 | ORAL_TABLET | Freq: Two times a day (BID) | ORAL | 0 refills | Status: AC

## 2024-03-14 NOTE — Progress Notes (Signed)

## 2024-03-14 NOTE — Progress Notes (Signed)
 I have spent 5 minutes in review of e-visit questionnaire, review and updating patient chart, medical decision making and response to patient.   Piedad Climes, PA-C

## 2024-05-21 ENCOUNTER — Encounter: Payer: Self-pay | Admitting: Sports Medicine
